# Patient Record
Sex: Female | Born: 1985
Health system: Southern US, Community
[De-identification: ages and names within clinical notes are randomized; demographics above are authoritative.]

## PROBLEM LIST (undated history)

## (undated) DIAGNOSIS — M1990 Unspecified osteoarthritis, unspecified site: Secondary | ICD-10-CM

## (undated) DIAGNOSIS — E039 Hypothyroidism, unspecified: Secondary | ICD-10-CM

## (undated) DIAGNOSIS — Z923 Personal history of irradiation: Secondary | ICD-10-CM

## (undated) DIAGNOSIS — J324 Chronic pansinusitis: Secondary | ICD-10-CM

## (undated) HISTORY — DX: Hypothyroidism, unspecified: E03.9

## (undated) HISTORY — PX: KNEE ARTHROSCOPY: SUR90

## (undated) HISTORY — DX: Personal history of irradiation: Z92.3

---
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
## 2006-09-25 ENCOUNTER — Ambulatory Visit: Payer: Self-pay | Admitting: Family Medicine

## 2007-04-29 ENCOUNTER — Ambulatory Visit: Payer: Self-pay | Admitting: Family Medicine

## 2007-04-29 DIAGNOSIS — E039 Hypothyroidism, unspecified: Secondary | ICD-10-CM | POA: Insufficient documentation

## 2007-05-03 LAB — CONVERTED CEMR LAB
ALT: 18 units/L (ref 0–35)
AST: 24 units/L (ref 0–37)
Albumin: 4 g/dL (ref 3.5–5.2)
Alkaline Phosphatase: 58 units/L (ref 39–117)
BUN: 12 mg/dL (ref 6–23)
Basophils Absolute: 0 10*3/uL (ref 0.0–0.1)
Basophils Relative: 0.4 % (ref 0.0–1.0)
Bilirubin, Direct: 0.1 mg/dL (ref 0.0–0.3)
CO2: 26 meq/L (ref 19–32)
Calcium: 9.4 mg/dL (ref 8.4–10.5)
Chloride: 102 meq/L (ref 96–112)
Cholesterol: 164 mg/dL (ref 0–200)
Creatinine, Ser: 0.7 mg/dL (ref 0.4–1.2)
Eosinophils Absolute: 0.4 10*3/uL (ref 0.0–0.6)
Eosinophils Relative: 8.3 % — ABNORMAL HIGH (ref 0.0–5.0)
GFR calc Af Amer: 133 mL/min
GFR calc non Af Amer: 110 mL/min
Glucose, Bld: 82 mg/dL (ref 70–99)
HCT: 38.4 % (ref 36.0–46.0)
HDL: 48.5 mg/dL (ref >39.0)
Hemoglobin: 12.9 g/dL (ref 12.0–15.0)
LDL Cholesterol: 100 mg/dL — ABNORMAL HIGH (ref 0–99)
Lymphocytes Relative: 29.4 % (ref 12.0–46.0)
MCHC: 33.6 g/dL (ref 30.0–36.0)
MCV: 83 fL (ref 78.0–100.0)
Monocytes Absolute: 0.4 10*3/uL (ref 0.2–0.7)
Monocytes Relative: 9.4 % (ref 3.0–11.0)
Neutro Abs: 2.2 10*3/uL (ref 1.4–7.7)
Neutrophils Relative %: 52.5 % (ref 43.0–77.0)
Platelets: 270 10*3/uL (ref 150–400)
Potassium: 4.3 meq/L (ref 3.5–5.1)
RBC: 4.63 M/uL (ref 3.87–5.11)
RDW: 11.8 % (ref 11.5–14.6)
Sodium: 136 meq/L (ref 135–145)
TSH: 7.43 microintl units/mL — ABNORMAL HIGH (ref 0.35–5.50)
Total Bilirubin: 0.7 mg/dL (ref 0.3–1.2)
Total CHOL/HDL Ratio: 3.4
Total Protein: 7.1 g/dL (ref 6.0–8.3)
Triglycerides: 78 mg/dL (ref 0–149)
VLDL: 16 mg/dL (ref 0–40)
WBC: 4.3 10*3/uL — ABNORMAL LOW (ref 4.5–10.5)

## 2007-06-08 ENCOUNTER — Ambulatory Visit: Payer: Self-pay | Admitting: Family Medicine

## 2007-06-09 LAB — CONVERTED CEMR LAB: TSH: 5.45 microintl units/mL (ref 0.35–5.50)

## 2007-08-19 ENCOUNTER — Ambulatory Visit: Payer: Self-pay | Admitting: Internal Medicine

## 2007-09-30 ENCOUNTER — Ambulatory Visit: Payer: Self-pay | Admitting: Family Medicine

## 2007-10-29 ENCOUNTER — Ambulatory Visit: Payer: Self-pay | Admitting: Family Medicine

## 2007-10-29 ENCOUNTER — Encounter (INDEPENDENT_AMBULATORY_CARE_PROVIDER_SITE_OTHER): Payer: Self-pay | Admitting: Internal Medicine

## 2007-11-01 LAB — CONVERTED CEMR LAB: TSH: 2.965 microintl units/mL (ref 0.350–5.50)

## 2007-11-25 ENCOUNTER — Ambulatory Visit: Payer: Self-pay | Admitting: Family Medicine

## 2008-01-24 ENCOUNTER — Ambulatory Visit: Payer: Self-pay | Admitting: Family Medicine

## 2008-01-31 LAB — CONVERTED CEMR LAB
BUN: 11 mg/dL (ref 6–23)
Basophils Relative: 0 % (ref 0.0–1.0)
CO2: 26 meq/L (ref 19–32)
Chloride: 105 meq/L (ref 96–112)
Creatinine, Ser: 0.9 mg/dL (ref 0.4–1.2)
Eosinophils Relative: 3.4 % (ref 0.0–5.0)
FSH: 0.7 milliintl units/mL
Lymphocytes Relative: 23.7 % (ref 12.0–46.0)
Neutrophils Relative %: 67.3 % (ref 43.0–77.0)
RBC: 4.65 M/uL (ref 3.87–5.11)
WBC: 6.4 10*3/uL (ref 4.5–10.5)

## 2008-02-04 ENCOUNTER — Ambulatory Visit: Payer: Self-pay | Admitting: Family Medicine

## 2008-02-04 ENCOUNTER — Encounter (INDEPENDENT_AMBULATORY_CARE_PROVIDER_SITE_OTHER): Payer: Self-pay | Admitting: Internal Medicine

## 2008-03-17 ENCOUNTER — Encounter: Payer: Self-pay | Admitting: Family Medicine

## 2008-03-17 ENCOUNTER — Ambulatory Visit: Payer: Self-pay | Admitting: Family Medicine

## 2008-03-21 LAB — CONVERTED CEMR LAB: TSH: 1.607 microintl units/mL (ref 0.350–5.50)

## 2008-11-21 ENCOUNTER — Telehealth (INDEPENDENT_AMBULATORY_CARE_PROVIDER_SITE_OTHER): Payer: Self-pay | Admitting: Internal Medicine

## 2008-11-22 ENCOUNTER — Ambulatory Visit: Payer: Self-pay | Admitting: Family Medicine

## 2008-11-24 LAB — CONVERTED CEMR LAB: TSH: 0.4 microintl units/mL (ref 0.35–5.50)

## 2008-11-25 ENCOUNTER — Ambulatory Visit: Payer: Self-pay | Admitting: Family Medicine

## 2009-02-17 LAB — CONVERTED CEMR LAB
Pap Smear: NORMAL
Pap Smear: NORMAL

## 2009-06-21 ENCOUNTER — Ambulatory Visit: Payer: Self-pay | Admitting: Family Medicine

## 2009-11-27 ENCOUNTER — Telehealth (INDEPENDENT_AMBULATORY_CARE_PROVIDER_SITE_OTHER): Payer: Self-pay | Admitting: *Deleted

## 2009-11-30 ENCOUNTER — Ambulatory Visit: Payer: Self-pay | Admitting: Family Medicine

## 2009-11-30 DIAGNOSIS — R0789 Other chest pain: Secondary | ICD-10-CM | POA: Insufficient documentation

## 2009-12-03 LAB — CONVERTED CEMR LAB: TSH: 0.925 microintl units/mL (ref 0.350–4.500)

## 2010-01-04 ENCOUNTER — Ambulatory Visit: Payer: Self-pay | Admitting: Family Medicine

## 2010-01-04 DIAGNOSIS — J029 Acute pharyngitis, unspecified: Secondary | ICD-10-CM

## 2010-01-09 ENCOUNTER — Telehealth: Payer: Self-pay | Admitting: Family Medicine

## 2010-10-30 ENCOUNTER — Telehealth: Payer: Self-pay | Admitting: Family Medicine

## 2010-11-19 NOTE — Assessment & Plan Note (Signed)
Summary: MED REFILL/RBH   Vital Signs:  Patient profile:   24 year old female Height:      64 inches Weight:      129.2 pounds BMI:     22.26 Temp:     98.3 degrees F oral Pulse rate:   80 / minute Pulse rhythm:   regular BP sitting:   108 / 62  (left arm) Cuff size:   regular  Vitals Entered By: Benny Lennert CMA [facility]) (November 30, 2009 4:39 PM)  History of Present Illness: Chief complaint med refill   Needs med refill of thyroid medicaiton. Due for yearly TSH.  Was sick with cold few weeks ago.. One episode of sharp pain  left chest for few days. Some heaviness in following days, no chest pain now.  Occ feels heart beat skips...  No decongestant, but was on other medicaiton.  Has history of asthma.   Also dry flacky skin on hands.   Problems Prior to Update: 1)  Skin Rash  (ICD-782.1) 2)  Eustachian Tube Dysfunction  (ICD-381.81) 3)  Viral Exanthem  (ICD-057.9) 4)  Uri  (ICD-465.9) 5)  Weight Loss  (ICD-783.21) 6)  Fatigue  (ICD-780.79) 7)  Hypothyroidism  (ICD-244.9)  Current Medications (verified): 1)  Ortho Tri-Cyclen Lo 0.025 Mg Tabs (Norgestimate-Ethinyl Estradiol) .... Take 1 Tablet By Mouth Once A Day 2)  Levothroid 150 Mcg  Tabs (Levothyroxine Sodium) .Marland Kitchen.. 1 Daily By Mouth 3)  Multivitamins   Tabs (Multiple Vitamin) .... One Daily 4)  Nasonex 50 Mcg/act Susp (Mometasone Furoate) .... 2 Sprays Each Nostril Once Daily 5)  Triamcinolone Acetonide 0.5 % Crea (Triamcinolone Acetonide) .... Apply To Two Times A Day X 2 Weeks.  Allergies: 1)  ! * Codiene  Past History:  Past medical, surgical, family and social histories (including risk factors) reviewed, and no changes noted (except as noted below).  Past Medical History: Reviewed history from 08/19/2007 and no changes required. Hypothyroidism  Past Surgical History: Reviewed history from 08/19/2007 and no changes required. RAI for hyperthyroidism  Family History: Reviewed history and no  changes required.  Social History: Reviewed history from 11/25/2007 and no changes required. Occupation: PE Runner, broadcasting/film/video, works at gym Single Never Smoked Alcohol use-occ  moved t [place]--bought a house.  Review of Systems General:  Denies fatigue and fever. ENT:  Denies earache, postnasal drainage, sinus pressure, and sore throat. Resp:  Denies shortness of breath. GI:  Denies abdominal pain. GU:  Denies dysuria. Derm:  very dry flacky skin on fingers, fingertips..  Physical Exam  General:  Well-developed,well-nourished,in no acute distress; alert,appropriate and cooperative throughout examination Eyes:  No corneal or conjunctival inflammation noted. EOMI. Perrla. Funduscopic exam benign, without hemorrhages, exudates or papilledema. Vision grossly normal. Ears:  External ear exam shows no significant lesions or deformities.  Otoscopic examination reveals clear canals, tympanic membranes are intact bilaterally without bulging, retraction, inflammation or discharge. Hearing is grossly normal bilaterally. Nose:  External nasal examination shows no deformity or inflammation. Nasal mucosa are pink and moist without lesions or exudates. Mouth:  Oral mucosa and oropharynx without lesions or exudates.  Teeth in good repair. Neck:  no carotid bruit or thyromegaly no cervical or supraclavicular lymphadenopathy  Lungs:  Normal respiratory effort, chest expands symmetrically. Lungs are clear to auscultation, no crackles or wheezes. Heart:  Normal rate and regular rhythm. S1 and S2 normal without gallop, murmur, click, rub or other extra sounds. Pulses:  R and L posterior tibial pulses are full and equal bilaterally  Extremities:  no edema  Skin:  dry flacky skin on B hands   Impression & Recommendations:  Problem # 1:  HYPOTHYROIDISM (ICD-244.9) Chest symptoms may be due to poorly control thyroid. Due for eval. medicaiton was already refilled for a year priopr to OV.  Her updated medication  list for this problem includes:    Levothroid 150 Mcg Tabs (Levothyroxine sodium) .Marland Kitchen... 1 daily by mouth  Orders: Specimen Handling (13086) T-TSH ([address])  Problem # 2:  SKIN RASH (ICD-782.1) Hand dermatitis. Discussed hand care, moisturizing cream. USe topical steroid for flares x 2 weeks.  Her updated medication list for this problem includes:    Triamcinolone Acetonide 0.5 % Crea (Triamcinolone acetonide) .Marland Kitchen... Apply to two times a day x 2 weeks.  Problem # 3:  CHEST DISCOMFORT, ATYPICAL (ICD-786.59) Likely due to recent URI illness. Low risk for cardiac issues.  If recurs, make follow up for eval.   Complete Medication List: 1)  Ortho Tri-cyclen Lo 0.025 Mg Tabs (Norgestimate-ethinyl estradiol) .... Take 1 tablet by mouth once a day 2)  Levothroid 150 Mcg Tabs (Levothyroxine sodium) .Marland Kitchen.. 1 daily by mouth 3)  Multivitamins Tabs (Multiple vitamin) .... One daily 4)  Nasonex 50 Mcg/act Susp (Mometasone furoate) .... 2 sprays each nostril once daily 5)  Triamcinolone Acetonide 0.5 % Crea (Triamcinolone acetonide) .... Apply to two times a day x 2 weeks.  Patient Instructions: 1)  Steroid cream on hands. 2)  Change to cream ..like eucerin, cetaphil cream. 3)  Hypoallergenic soap.Marland Kitchen 4)  Follpow up if heart racing not improving.  Prescriptions: TRIAMCINOLONE ACETONIDE 0.5 % CREA (TRIAMCINOLONE ACETONIDE) Apply to two times a day x 2 weeks.  #30 gm x 0   Entered and Authorized by:   Kerby Nora MD   Signed by:   Kerby Nora MD on 11/30/2009   Method used:   Electronically to        [facility] (retail)       [address]       [place], [place]  [address]       Ph: [phone]       Fax: [phone]   RxID:   [id number]     Current Allergies (reviewed today): ! * CODIENE  PAP Result Date:  02/17/2009 PAP Result:  normal PAP Next Due:  1 yr

## 2010-11-19 NOTE — Assessment & Plan Note (Signed)
Summary: SORE THROAT/DLO   Vital Signs:  Patient profile:   25 year old female Height:      64 inches Weight:      126.13 pounds BMI:     21.73 Temp:     98.5 degrees F oral Pulse rate:   80 / minute Pulse rhythm:   regular BP sitting:   104 / 72  (left arm) Cuff size:   regular  Vitals Entered By: Delilah Shan CMA [facility]) (January 04, 2010 8:13 AM) CC: Congestion, ST   History of Present Illness: 25 yo healthy female here with sore throat. Was congested on and off for past month but a few days ago, throat became very sore. Hurts to swallow. Felt feverish. No rashes or abdominal pain. School teacher, strep throat going around school.  Felt a bump in back of her tongue yesterday, not painful.  Current Medications (verified): 1)  Ortho Tri-Cyclen Lo 0.025 Mg Tabs (Norgestimate-Ethinyl Estradiol) .... Take 1 Tablet By Mouth Once A Day 2)  Levothroid 150 Mcg  Tabs (Levothyroxine Sodium) .Marland Kitchen.. 1 Daily By Mouth 3)  Multivitamins   Tabs (Multiple Vitamin) .... One Daily  Allergies: 1)  ! * Codiene  Review of Systems      See HPI General:  Complains of fever; denies chills. Resp:  Denies cough, shortness of breath, sputum productive, and wheezing. GI:  Denies abdominal pain, nausea, and vomiting.  Physical Exam  General:  Well-developed,well-nourished,in no acute distress; alert,appropriate and cooperative throughout examination Ears:  External ear exam shows no significant lesions or deformities.  Otoscopic examination reveals clear canals, tympanic membranes are intact bilaterally without bulging, retraction, inflammation or discharge. Hearing is grossly normal bilaterally. Mouth:  pharyngeal erythema.   no exudates. small, firm, pink nodule on back of left tongue Lungs:  Normal respiratory effort, chest expands symmetrically. Lungs are clear to auscultation, no crackles or wheezes. Heart:  Normal rate and regular rhythm. S1 and S2 normal without gallop, murmur, click, rub  or other extra sounds. Psych:  Cognition and judgment appear intact. Alert and cooperative with normal attention span and concentration. No apparent delusions, illusions, hallucinations   Impression & Recommendations:  Problem # 1:  ACUTE PHARYNGITIS (ICD-462) Assessment New Rapid strep neg, no other cardinal symptoms. Likely viral.  Continue supportive care with Tylenol. Area on tongue likely reactive or fibroma, advised if no improvement in 1-2 weeks, needs follow up and referral to ENT.  Orders: Rapid Strep (13086)  Complete Medication List: 1)  Ortho Tri-cyclen Lo 0.025 Mg Tabs (Norgestimate-ethinyl estradiol) .... Take 1 tablet by mouth once a day 2)  Levothroid 150 Mcg Tabs (Levothyroxine sodium) .Marland Kitchen.. 1 daily by mouth 3)  Multivitamins Tabs (Multiple vitamin) .... One daily 4)  Proair Hfa 108 (90 Base) Mcg/act Aers (Albuterol sulfate) .... 2 inh q4h as needed shortness of breath Prescriptions: PROAIR HFA 108 (90 BASE) MCG/ACT  AERS (ALBUTEROL SULFATE) 2 inh q4h as needed shortness of breath  #1 x 0   Entered and Authorized by:   Ruthe Mannan MD   Signed by:   Ruthe Mannan MD on 01/04/2010   Method used:   Electronically to        [facility] (retail)       [address]       [place], [place]  [address]       Ph: [phone]       Fax: [phone]   RxID:  [id number]   Current Allergies (reviewed today): ! * CODIENE Laboratory Results   Date/Time Reported: January 04, 2010 8:26 AM   Other Tests  Rapid Strep: negative

## 2010-11-19 NOTE — Progress Notes (Signed)
Summary: Not feeling any better  Phone Note Call from Patient Call back at Home Phone [phone]   Caller: Patient Call For: Dr. Dayton Martes Summary of Call: Patient was seen on 03/18 for a sore throat and was told if she was no better to call back and Dr. Dayton Martes would give her an antibiotic.  She is feeling no better.  Uses [facility].  Please advise. Initial call taken by: Linde Gillis CMA [facility]),  January 09, 2010 4:05 PM    New/Updated Medications: AZITHROMYCIN 250 MG  TABS (AZITHROMYCIN) 2 by  mouth today and then 1 daily for 4 days Prescriptions: AZITHROMYCIN 250 MG  TABS (AZITHROMYCIN) 2 by  mouth today and then 1 daily for 4 days  #6 x 0   Entered and Authorized by:   Ruthe Mannan MD   Signed by:   Ruthe Mannan MD on 01/09/2010   Method used:   Electronically to        [facility] (retail)       [address]       [place], [place]  [address]       Ph: [phone]       Fax: [phone]   RxID:   [id number]

## 2010-11-19 NOTE — Progress Notes (Signed)
Summary: refill request for levothroid  Phone Note Refill Request Message from:  Fax from Pharmacy  Refills Requested: Medication #1:  LEVOTHROID 150 MCG  TABS 1 DAILY BY MOUTH   Last Refilled: 09/02/2009 [facility]axed [facility] from [facility] [place].  I know that I am supposed to refil this for one year but her last tsh was a year ago and she has since only been seen for ear pain.  Please advise.  Initial call taken by: Lowella Petties CMA,  November 27, 2009 11:01 AM  Follow-up for Phone Call        Needs to make appt for CPX with labs prior TSH Dx 244.9, CMET, lipids. Dx v77.91 Unless having CPX at GYN.  Refill until appt.  Follow-up by: Kerby Nora MD,  November 27, 2009 2:15 PM  Additional Follow-up for Phone Call Additional follow up Details #1::        Left message on voicemail for patient to return call.  Linde Gillis CMA [facility])  November 27, 2009 2:50 PM   Spoke with patient, she has an appt with Dr. Ermalene Searing on 11/30/2009.  She sees a GYN for her yearly pap smears.  Will go ahead and [facility] [place] for thyroid medication.   Additional Follow-up by: Linde Gillis CMA [facility]),  November 28, 2009 12:21 PM

## 2010-11-21 NOTE — Progress Notes (Signed)
Summary: regarding office visit  Phone Note Call from Patient Call back at [facility] Phone [phone]   Caller: Patient Summary of Call: This is a former pt of Billie's that saw you last february to renew her meds.  She is again due for appt.  She is asking if she can just come in for labwork only.  Do you want her to schedule a get established visit with you, since she has not had that yet, or a regular visit or just labs? Initial call taken by: Lowella Petties CMA, AAMA,  October 30, 2010 9:37 AM  Follow-up for Phone Call        Needs appt for labs and CPX unless CPX done at GYN. If done at GYN have her schedule an 15 min follow up. Follow-up by: Kerby Nora MD,  October 30, 2010 10:13 PM  Additional Follow-up for Phone Call Additional follow up Details #1::        Patient advised via personal voice mail.Consuello Masse CMA   Additional Follow-up by: Benny Lennert CMA [facility]),  November 01, 2010 1:18 PM

## 2010-11-28 ENCOUNTER — Encounter: Payer: Self-pay | Admitting: Family Medicine

## 2011-01-17 ENCOUNTER — Telehealth: Payer: Self-pay | Admitting: Family Medicine

## 2011-01-17 DIAGNOSIS — E039 Hypothyroidism, unspecified: Secondary | ICD-10-CM

## 2011-01-17 DIAGNOSIS — Z13220 Encounter for screening for lipoid disorders: Secondary | ICD-10-CM

## 2011-01-17 NOTE — Telephone Encounter (Signed)
Message copied by Kerby Nora on Fri Jan 17, 2011 11:00 AM ------      Message from: Margarite Gouge, NATASHA      Created: Thu Jan 16, 2011 11:20 AM      Regarding: Cpx labs mon       Please order  future cpx labs for pt's upcomming lab appt.      Thanks      Rodney Booze

## 2011-01-20 ENCOUNTER — Other Ambulatory Visit (INDEPENDENT_AMBULATORY_CARE_PROVIDER_SITE_OTHER): Payer: BC Managed Care – PPO | Admitting: Family Medicine

## 2011-01-20 DIAGNOSIS — E039 Hypothyroidism, unspecified: Secondary | ICD-10-CM

## 2011-01-20 DIAGNOSIS — Z13220 Encounter for screening for lipoid disorders: Secondary | ICD-10-CM

## 2011-01-20 LAB — COMPREHENSIVE METABOLIC PANEL
ALT: 22 U/L (ref 0–35)
AST: 24 U/L (ref 0–37)
Albumin: 3.7 g/dL (ref 3.5–5.2)
Alkaline Phosphatase: 71 U/L (ref 39–117)
BUN: 9 mg/dL (ref 6–23)
Potassium: 4.6 mEq/L (ref 3.5–5.1)
Sodium: 135 mEq/L (ref 135–145)

## 2011-01-20 LAB — LIPID PANEL
HDL: 48.1 mg/dL (ref >39.00)
LDL Cholesterol: 106 mg/dL — ABNORMAL HIGH (ref 0–99)
Total CHOL/HDL Ratio: 3
Triglycerides: 43 mg/dL (ref 0.0–149.0)

## 2011-01-21 ENCOUNTER — Encounter: Payer: Self-pay | Admitting: Family Medicine

## 2011-01-21 ENCOUNTER — Ambulatory Visit (INDEPENDENT_AMBULATORY_CARE_PROVIDER_SITE_OTHER): Payer: BC Managed Care – PPO | Admitting: Family Medicine

## 2011-01-21 VITALS — BP 92/70 | HR 82 | Temp 98.3°F | Ht 65.0 in | Wt 125.8 lb

## 2011-01-21 DIAGNOSIS — E039 Hypothyroidism, unspecified: Secondary | ICD-10-CM

## 2011-01-21 DIAGNOSIS — R5381 Other malaise: Secondary | ICD-10-CM

## 2011-01-21 DIAGNOSIS — R5383 Other fatigue: Secondary | ICD-10-CM | POA: Insufficient documentation

## 2011-01-21 MED ORDER — LEVOTHYROXINE SODIUM 150 MCG PO TABS: 150.0000 ug | ORAL_TABLET | Freq: Every day | ORAL | Status: DC

## 2011-01-21 NOTE — Assessment & Plan Note (Signed)
Lab Results  Component Value Date   TSH 0.90 01/20/2011   Well controlled. Continue current medication.

## 2011-01-21 NOTE — Assessment & Plan Note (Addendum)
Shaking spells prior to lunch.. Feels better when eats... Likely hypoglycemia given low fat mass and very active individual.  Increase protein and snacks between meals.  Thyroid well controlled and no suggestion of other cause of fatigue (labs reviewed).. Not occuring on a daily basis.

## 2011-01-21 NOTE — Patient Instructions (Addendum)
Look into tetanus .Marland Kitchen TDAP ot TD... As to when last received. If not in the last 10 years call for nurse visit to receive TDap. Increase protein in diet, eat healthy snacks in between meals.  Avoid high carb foods.

## 2011-01-21 NOTE — Progress Notes (Signed)
Subjective:    Patient ID: [facility], female    DOB: [birth year], 25 y.o.   MRN: [id number]  HPI Here for yearly follow up.  Last saw GYN almost 1 year ago.  On OCP.Marland Kitchen Doing well...some SE now on higher dose ortho tricyclen. Did better on low but insurance does not cover.   Whooping cough found at school... Not mandated to take medication. No current cough, cold symptoms. No fever.  Occasionally feels like blood sugar is low... Feels shaky weak, lightheaded. Lst time was 1 week ago.. Had eated egg sandwich...4 hours earlier. Did have some increase in frequency of urination. Did not check blood sugar. Checked blood sugar 2 hours after lunch was 109.  Hypothyroid.. Well controlled.     Review of Systems  Constitutional: Negative for fever, fatigue and unexpected weight change.  HENT: Negative for ear pain, congestion, sore throat, sneezing, trouble swallowing and sinus pressure.   Eyes: Negative for pain and itching.  Respiratory: Negative for cough, shortness of breath and wheezing.   Cardiovascular: Negative for chest pain, palpitations and leg swelling.  Gastrointestinal: Negative for nausea, abdominal pain, diarrhea, constipation and blood in stool.  Genitourinary: Negative for dysuria, hematuria, vaginal discharge, difficulty urinating and menstrual problem.  Skin: Negative for rash.  Neurological: Negative for syncope, weakness, light-headedness, numbness and headaches.  Psychiatric/Behavioral: Negative for confusion and dysphoric mood. The patient is not nervous/anxious.        Objective:   Physical Exam  Constitutional: Vital signs are normal. She appears well-developed and well-nourished. She is cooperative.  Non-toxic appearance. She does not appear ill. No distress.  HENT:  Head: Normocephalic.  Right Ear: Hearing, tympanic membrane, external ear and ear canal normal. Tympanic membrane is not erythematous, not retracted and not bulging.  Left Ear: Hearing,  tympanic membrane, external ear and ear canal normal. Tympanic membrane is not erythematous, not retracted and not bulging.  Nose: Mucosal edema and rhinorrhea present. Right sinus exhibits no maxillary sinus tenderness and no frontal sinus tenderness. Left sinus exhibits no maxillary sinus tenderness and no frontal sinus tenderness.  Mouth/Throat: Uvula is midline, oropharynx is clear and moist and mucous membranes are normal.  Eyes: Conjunctivae, EOM and lids are normal. Pupils are equal, round, and reactive to light. No foreign bodies found.  Neck: Trachea normal and normal range of motion. Neck supple. Carotid bruit is not present. No mass and no thyromegaly present.  Cardiovascular: Normal rate, regular rhythm, S1 normal, S2 normal, normal heart sounds, intact distal pulses and normal pulses.  Exam reveals no gallop and no friction rub.   No murmur heard. Pulmonary/Chest: Effort normal and breath sounds normal. Not tachypneic. No respiratory distress. She has no decreased breath sounds. She has no wheezes. She has no rhonchi. She has no rales.  Abdominal: Soft. Normal appearance and bowel sounds are normal. She exhibits no fluid wave and no abdominal bruit. There is no hepatosplenomegaly. There is no tenderness. There is no rebound and no CVA tenderness. No hernia.  Genitourinary: Vagina normal and uterus normal.  Neurological: She is alert. She has normal strength. No cranial nerve deficit or sensory deficit.  Skin: Skin is warm, dry and intact. No rash noted.  Psychiatric: Her speech is normal and behavior is normal. Judgment normal. Her mood appears not anxious. Cognition and memory are normal. She does not exhibit a depressed mood.          Assessment & Plan:  No physical performed today given Pt sees GYN. Updated prevention.Marland KitchenMarland Kitchen  May be due for Tdap. Labs reviewed in detail with pt.

## 2011-02-12 ENCOUNTER — Encounter: Payer: Self-pay | Admitting: Family Medicine

## 2011-02-12 ENCOUNTER — Ambulatory Visit (INDEPENDENT_AMBULATORY_CARE_PROVIDER_SITE_OTHER): Payer: BC Managed Care – PPO | Admitting: Family Medicine

## 2011-02-12 DIAGNOSIS — R197 Diarrhea, unspecified: Secondary | ICD-10-CM | POA: Insufficient documentation

## 2011-02-12 DIAGNOSIS — H6980 Other specified disorders of Eustachian tube, unspecified ear: Secondary | ICD-10-CM

## 2011-02-12 DIAGNOSIS — H6990 Unspecified Eustachian tube disorder, unspecified ear: Secondary | ICD-10-CM | POA: Insufficient documentation

## 2011-02-12 NOTE — Patient Instructions (Signed)
I would get an over the counter decongestant and take this before the plane trip.  You can use nasal saline in the meantime, this may also help.  Let us know if the abdominal pain is progressive, if you have fever, blood in your stool, or other concerns.  Take care.

## 2011-02-12 NOTE — Progress Notes (Signed)
L ear pain yesterday.  Felt clogged yesterday.  Used OTC drops with some relief.  Some pain on R ear but less than L.  No rhinorrhea, no ST.  No fevers, no vomiting.  Occ abdominal pains intermittently (and in multiple quadrants) over last few days, diarrhea yesterday.  No other complaints.   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    Meds, vitals, and allergies reviewed.   ROS: See HPI.  Otherwise, noncontributory.  nad ncat Tm wnl, no erythema but tm sluggish on valsalva Nasal exam with mild erythema, op with mild erythema but no exudates Neck supple rrr ctab abd soft, RLQ mildly ttp but without rebound, not ttp o/w.

## 2011-02-12 NOTE — Assessment & Plan Note (Signed)
Likely from a viral process.  Benign exam.  I would treat supportively.  She is getting ready to fly and a decongestant may help some.  Fu prn.

## 2011-02-12 NOTE — Assessment & Plan Note (Signed)
Not dehydrated and benign exam.  The RLQ pain is mild. I don't suspect sig pathology (ie appendicitis) since she has been having sx for the last 5 days and the sx are intermittent.  I would have her follow this clinically and report back as needed.  She agrees.  She is nontoxic, well appearing.  D/w pt and she understood.

## 2011-02-24 ENCOUNTER — Other Ambulatory Visit: Payer: Self-pay | Admitting: *Deleted

## 2011-02-24 MED ORDER — LEVOTHYROXINE SODIUM 150 MCG PO TABS: 150.0000 ug | ORAL_TABLET | Freq: Every day | ORAL | Status: DC

## 2011-02-24 NOTE — Telephone Encounter (Signed)
Patient was traveling this weekend and lost her prescription for her thyroid medication. Patient needs a new script sent to the pharmacy for her thyroid medication. Pharmacy- [facility] [place]

## 2011-03-07 NOTE — Assessment & Plan Note (Signed)
[facility]                           [facility] NOTE   Sheena, Harris                         MRN:          [id number]  DATE:09/25/2006                            DOB:          [birth year]    CHIEF COMPLAINT:  A 25 year old female here to establish new doctor.   HISTORY OF PRESENT ILLNESS:  Sheena Harris moved to the area from [place]  following going to school at [employer].  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  She has the following concerns.  1. Hypothyroidism:  She initially had hyperthyroidism.  Was treated      with PTU but had side effects.  She then received radioactive      iodine which finally resulted in hypothyroidism.  She now gets      levothyroxine 88 mcg daily.  She states her TSH was checked last in      August 2007.  She states at that time it was high normal and so she      was told to have it checked several months later.  She also needs      refills of this medication.  2. Several moles that she would like to have evaluated:  She states      that there is a new, slightly itchy mole on her right buttock that      is erythematous, not bleeding.  There are also several moles on her      back that have been there forever but she just likes to keep an eye      on it.   REVIEW OF SYSTEMS:  No headache, no syncope, no dizziness, no hearing  problems, no vision problems.  No dyspnea, no chest pain, no  palpitations.  No nausea or vomiting, diarrhea, constipation, bleeding.   PAST MEDICAL HISTORY:  Hypothyroidism, status post radioactive iodine,  now hypothyroid.   HOSPITALIZATIONS/SURGERY/PROCEDURES:  1. In 2002 and 2003, right knee surgery x2 for torn medial meniscus.  2. In 2002, deviated septum repair.  3. Pap smear negative in January 2006.   ALLERGIES:  CODEINE causing nausea and vomiting.   MEDICATIONS:  1. Levothyroxine 88 mcg daily.  2. Ortho  Tri-Cyclen Lo daily.   FAMILY HISTORY:  Father alive at age 65, healthy.  Mother alive at age  64 with hypertension and breast cancer diagnosed at age 29.  BRCA  negative.  No MI before 65 in the family.  She has two sisters who are  healthy.  There is no family history of coronary artery disease or  diabetes.  She has an uncle with GI cancer of unknown type.   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  She  is single but is currently in a relationship with a female and they use  condoms.  She gets very regular exercise and actually in addition to her  job, goes to the gym three to five times per week.  She eats fruits and  vegetables.  No  fast food.  Lots of water.   PHYSICAL EXAMINATION:  VITAL SIGNS:  Height 65 inches.  Weight 130.  Blood pressure 108/80, pulse 80, temperature 98.2.  GENERAL:  Healthy-appearing female in no apparent distress.  HEENT:  PERRLA.  Extraocular muscles intact.  Oropharynx clear.  Tympanic membranes clear.  Nares clear.  NECK:  No thyromegaly.  No lymphadenopathy, supraclavicular or cervical.  CARDIOVASCULAR:  Regular rate and rhythm.  No murmurs, rubs or gallops.  Normal PMI.  2+ peripheral pulses.  No peripheral edema.  LUNGS:  Clear to auscultation bilaterally.  No wheezes, rales or  rhonchi.  ABDOMEN:  Soft, nontender.  Normoactive bowel sounds.  No  hepatosplenomegaly.  MUSCULOSKELETAL: Strength 5/5 in upper and lower extremities.  SKIN:  She has three lesions on her posterior neck.  Two that are  elevated approximately 0.5 cm, even, light-colored nevi and one that is  inferior.  It makes the inferior part of the isosceles triangle that  these three moles form.  The lower one is 3 mm in diameter. It is fairly  dark in color but is also fairly regular.  She states that as far as she  knows, these have not changed recently.  She also has several freckles  throughout her body that are not worrisome.  She has an area, a 0.5 cm   raised lesion on her right buttock that does not have worrisome  characteristics.   ASSESSMENT/PLAN:  1. Hypothyroidism.  We will check a TSH today.  Depending on the      results, we will refill her levothyroxine appropriately.  2. Skin lesions.  The majority of her lesions are normal nevi as well      as freckles.  The most concerning lesion is one on her neck that is      3 mm and dark in color.  We will watch this and will consider      removing it at her next appointment.  She also has an nonworrisome      area on her right posterior buttock which we will follow.  That      appears to be a nevi.  3. Prevention.  She is unsure of her last tetanus and I will obtain      her old records to determine this.  She will be due for her Pap      smear in January and will return for this as well as GC and      Chlamydia testing.  She refused flu vaccine today.     Sheena Nora, MD  Electronically Signed    AB/MedQ  DD: 09/25/2006  DT: 09/26/2006  Job #: [id number]

## 2012-02-18 ENCOUNTER — Other Ambulatory Visit: Payer: Self-pay | Admitting: Family Medicine

## 2012-02-20 ENCOUNTER — Other Ambulatory Visit: Payer: Self-pay | Admitting: *Deleted

## 2012-02-20 MED ORDER — LEVOTHYROXINE SODIUM 150 MCG PO TABS: 150.0000 ug | ORAL_TABLET | Freq: Every day | ORAL | Status: DC

## 2012-04-21 ENCOUNTER — Telehealth: Payer: Self-pay | Admitting: Family Medicine

## 2012-04-21 DIAGNOSIS — E039 Hypothyroidism, unspecified: Secondary | ICD-10-CM

## 2012-04-21 NOTE — Telephone Encounter (Signed)
Message copied by Excell Seltzer on Wed Apr 21, 2012  8:44 AM ------      Message from: Baldomero Lamy      Created: Wed Apr 21, 2012  7:38 AM      Regarding: cpx labs Fri 7/5       Please order  future cpx labs for pt's upcomming lab appt.      Thanks      Rodney Booze

## 2012-04-23 ENCOUNTER — Other Ambulatory Visit (INDEPENDENT_AMBULATORY_CARE_PROVIDER_SITE_OTHER): Payer: BC Managed Care – PPO

## 2012-04-23 DIAGNOSIS — E039 Hypothyroidism, unspecified: Secondary | ICD-10-CM

## 2012-04-23 LAB — TSH: TSH: 2.4 u[IU]/mL (ref 0.35–5.50)

## 2012-04-27 ENCOUNTER — Encounter: Payer: Self-pay | Admitting: Family Medicine

## 2012-04-27 ENCOUNTER — Ambulatory Visit (INDEPENDENT_AMBULATORY_CARE_PROVIDER_SITE_OTHER): Payer: BC Managed Care – PPO | Admitting: Family Medicine

## 2012-04-27 VITALS — BP 90/62 | HR 65 | Temp 98.1°F | Ht 65.0 in | Wt 122.8 lb

## 2012-04-27 DIAGNOSIS — E039 Hypothyroidism, unspecified: Secondary | ICD-10-CM

## 2012-04-27 MED ORDER — LEVOTHYROXINE SODIUM 150 MCG PO TABS: 150.0000 ug | ORAL_TABLET | Freq: Every day | ORAL | Status: DC

## 2012-04-27 NOTE — Patient Instructions (Signed)
Keep up good work!  

## 2012-04-27 NOTE — Assessment & Plan Note (Signed)
Well controlled on current dose of levothyroxine

## 2012-04-27 NOTE — Progress Notes (Signed)
  Subjective:    Patient ID: Sheena Harris, female    DOB: 08/14/1986, 26 y.o.   MRN: [id number]  HPI  26 year old female presents for annual follow up thyroid eval.  Hypothyroid: Well controlled.   Lab Results  Component Value Date   TSH 2.40 04/23/2012   Regular exercise, healthy eating.  Goes to [facility].. Dr. Dellis Anes. Last year pap nml.  Review of Systems  Constitutional: Negative for fever, fatigue and unexpected weight change.  HENT: Negative for ear pain, congestion, sore throat, sneezing, trouble swallowing and sinus pressure.   Eyes: Negative for pain and itching.  Respiratory: Negative for cough, shortness of breath and wheezing.   Cardiovascular: Negative for chest pain, palpitations and leg swelling.  Gastrointestinal: Negative for nausea, abdominal pain, diarrhea, constipation and blood in stool.  Genitourinary: Negative for dysuria, hematuria, vaginal discharge, difficulty urinating and menstrual problem.  Skin: Negative for rash.  Neurological: Negative for syncope, weakness, light-headedness, numbness and headaches.  Psychiatric/Behavioral: Negative for confusion and dysphoric mood. The patient is not nervous/anxious.        Objective:   Physical Exam  Constitutional: Vital signs are normal. She appears well-developed and well-nourished. She is cooperative.  Non-toxic appearance. She does not appear ill. No distress.  HENT:  Head: Normocephalic.  Right Ear: Hearing, tympanic membrane, external ear and ear canal normal. Tympanic membrane is not erythematous, not retracted and not bulging.  Left Ear: Hearing, tympanic membrane, external ear and ear canal normal. Tympanic membrane is not erythematous, not retracted and not bulging.  Nose: No mucosal edema or rhinorrhea. Right sinus exhibits no maxillary sinus tenderness and no frontal sinus tenderness. Left sinus exhibits no maxillary sinus tenderness and no frontal sinus tenderness.  Mouth/Throat: Uvula is  midline, oropharynx is clear and moist and mucous membranes are normal.  Eyes: Conjunctivae, EOM and lids are normal. Pupils are equal, round, and reactive to light. No foreign bodies found.  Neck: Trachea normal and normal range of motion. Neck supple. Carotid bruit is not present. No mass and no thyromegaly present.  Cardiovascular: Normal rate, regular rhythm, S1 normal, S2 normal, normal heart sounds, intact distal pulses and normal pulses.  Exam reveals no gallop and no friction rub.   No murmur heard. Pulmonary/Chest: Effort normal and breath sounds normal. Not tachypneic. No respiratory distress. She has no decreased breath sounds. She has no wheezes. She has no rhonchi. She has no rales.  Abdominal: Soft. Normal appearance and bowel sounds are normal. There is no tenderness.  Neurological: She is alert.  Skin: Skin is warm, dry and intact. No rash noted.  Psychiatric: Her speech is normal and behavior is normal. Judgment and thought content normal. Her mood appears not anxious. Cognition and memory are normal. She does not exhibit a depressed mood.          Assessment & Plan:  The patient's preventative maintenance and recommended screening tests for an annual wellness exam were reviewed in full today. Brought up to date unless services declined.  Counselled on the importance of diet, exercise, and its role in overall health and mortality. The patient's FH and SH was reviewed, including their home life, tobacco status, and drug and alcohol status.   [place] uptodate: HPV and Td last given 2004, due  2014. PAP at GYN Mother with breast cancer at age 78, recurrent age 52. Plan mammo at age 49. No family history  colon cancer  LAst year lipids and DMscreen negative.  Nonsmoker.

## 2012-05-28 ENCOUNTER — Other Ambulatory Visit: Payer: BC Managed Care – PPO

## 2012-06-04 ENCOUNTER — Encounter: Payer: BC Managed Care – PPO | Admitting: Family Medicine

## 2013-05-10 ENCOUNTER — Other Ambulatory Visit: Payer: Self-pay | Admitting: Family Medicine

## 2013-05-25 ENCOUNTER — Telehealth: Payer: Self-pay | Admitting: Family Medicine

## 2013-05-25 ENCOUNTER — Other Ambulatory Visit (INDEPENDENT_AMBULATORY_CARE_PROVIDER_SITE_OTHER): Payer: BC Managed Care – PPO

## 2013-05-25 DIAGNOSIS — Z13220 Encounter for screening for lipoid disorders: Secondary | ICD-10-CM

## 2013-05-25 DIAGNOSIS — E039 Hypothyroidism, unspecified: Secondary | ICD-10-CM

## 2013-05-25 LAB — TSH: TSH: 1.21 u[IU]/mL (ref 0.35–5.50)

## 2013-05-25 LAB — LIPID PANEL
HDL: 49.4 mg/dL (ref >39.00)
Total CHOL/HDL Ratio: 3
Triglycerides: 59 mg/dL (ref 0.0–149.0)
VLDL: 11.8 mg/dL (ref 0.0–40.0)

## 2013-05-25 LAB — COMPREHENSIVE METABOLIC PANEL
ALT: 18 U/L (ref 0–35)
AST: 22 U/L (ref 0–37)
BUN: 11 mg/dL (ref 6–23)
Creatinine, Ser: 0.7 mg/dL (ref 0.4–1.2)
Total Bilirubin: 0.4 mg/dL (ref 0.3–1.2)

## 2013-05-25 NOTE — Telephone Encounter (Signed)
Message copied by Excell Seltzer on Wed May 25, 2013  8:29 AM ------      Message from: Sheena Harris      Created: Tue May 10, 2013 11:58 AM      Regarding: f/u labs Wed 8/6       Please order  future cpx labs for pt's upcoming lab appt.      Thanks      Tasha       ------

## 2013-06-01 ENCOUNTER — Encounter: Payer: Self-pay | Admitting: Family Medicine

## 2013-06-01 ENCOUNTER — Ambulatory Visit (INDEPENDENT_AMBULATORY_CARE_PROVIDER_SITE_OTHER): Payer: BC Managed Care – PPO | Admitting: Family Medicine

## 2013-06-01 VITALS — BP 100/70 | HR 60 | Temp 98.0°F | Ht 65.25 in | Wt 122.0 lb

## 2013-06-01 DIAGNOSIS — M79609 Pain in unspecified limb: Secondary | ICD-10-CM

## 2013-06-01 DIAGNOSIS — E039 Hypothyroidism, unspecified: Secondary | ICD-10-CM

## 2013-06-01 DIAGNOSIS — M79605 Pain in left leg: Secondary | ICD-10-CM | POA: Insufficient documentation

## 2013-06-01 MED ORDER — LEVOTHYROXINE SODIUM 150 MCG PO TABS: ORAL_TABLET | ORAL | Status: DC

## 2013-06-01 NOTE — Patient Instructions (Addendum)
Look into tetanus.. When it was last given? Push fluids. If any swelling, redness and recurrent pain in leg.. Call for further evaluation.

## 2013-06-01 NOTE — Assessment & Plan Note (Signed)
No sign of DVT.  Most liekly cramps.. Increase water, stretch after running.

## 2013-06-01 NOTE — Assessment & Plan Note (Signed)
Well controlled. Continue current medication.  

## 2013-06-01 NOTE — Progress Notes (Signed)
Subjective:    Patient ID: Sheena Harris, female    DOB: 12/30/1985, 27 y.o.   MRN: [id number]  HPI   Hypothyroidism Sheena Harris is a 27 y.o. female who presents for follow up of hypothyroidism. Current symptoms: none . Patient denies change in energy level, diarrhea, heat / cold intolerance, nervousness, palpitations and weight changes. Symptoms have been well-controlled.  Lab Results  Component Value Date   TSH 1.21 05/25/2013    Regular exercise, healthy eating.   Has noted few days of  intermittent pain in left leg 3 days in last week. Tender to touch, no swelling, no redness. Did not only happen on days she exercise.  She is on OCPs.   Review of Systems  Constitutional: Negative for fever, fatigue and unexpected weight change.  HENT: Negative for ear pain, congestion, sore throat, sneezing, trouble swallowing and sinus pressure.   Eyes: Negative for pain and itching.  Respiratory: Negative for cough, shortness of breath and wheezing.   Cardiovascular: Negative for chest pain, palpitations and leg swelling.  Gastrointestinal: Negative for nausea, abdominal pain, diarrhea, constipation and blood in stool.  Genitourinary: Negative for dysuria, hematuria, vaginal discharge, difficulty urinating and menstrual problem.  Skin: Negative for rash.  Neurological: Negative for syncope, weakness, light-headedness, numbness and headaches.  Psychiatric/Behavioral: Negative for confusion and dysphoric mood. The patient is not nervous/anxious.        Objective:   Physical Exam  Constitutional: Vital signs are normal. She appears well-developed and well-nourished. She is cooperative.  Non-toxic appearance. She does not appear ill. No distress.  HENT:  Head: Normocephalic.  Right Ear: Hearing, tympanic membrane, external ear and ear canal normal. Tympanic membrane is not erythematous, not retracted and not bulging.  Left Ear: Hearing, tympanic membrane, external ear and ear canal normal.  Tympanic membrane is not erythematous, not retracted and not bulging.  Nose: No mucosal edema or rhinorrhea. Right sinus exhibits no maxillary sinus tenderness and no frontal sinus tenderness. Left sinus exhibits no maxillary sinus tenderness and no frontal sinus tenderness.  Mouth/Throat: Uvula is midline, oropharynx is clear and moist and mucous membranes are normal.  Eyes: Conjunctivae, EOM and lids are normal. Pupils are equal, round, and reactive to light. Lids are everted and swept, no foreign bodies found.  Neck: Trachea normal and normal range of motion. Neck supple. Carotid bruit is not present. No mass and no thyromegaly present.  Cardiovascular: Normal rate, regular rhythm, S1 normal, S2 normal, normal heart sounds, intact distal pulses and normal pulses.  Exam reveals no gallop and no friction rub.   No murmur heard. Pulmonary/Chest: Effort normal and breath sounds normal. Not tachypneic. No respiratory distress. She has no decreased breath sounds. She has no wheezes. She has no rhonchi. She has no rales.  Abdominal: Soft. Normal appearance and bowel sounds are normal. There is no tenderness.  Neurological: She is alert.  Skin: Skin is warm, dry and intact. No rash noted.  No swelling, no varicose veins B legs.  Psychiatric: Her speech is normal and behavior is normal. Judgment and thought content normal. Her mood appears not anxious. Cognition and memory are normal. She does not exhibit a depressed mood.          Assessment & Plan:  The patient's preventative maintenance and recommended screening tests for an annual wellness exam were reviewed in full today. Brought up to date unless services declined.  Counselled on the importance of diet, exercise, and its role in overall health and mortality. The  patient's FH and SH was reviewed, including their home life, tobacco status, and drug and alcohol status.   Vaccines: HPV, last td ?   PAP/DVE: nml in 07/2012

## 2013-08-25 ENCOUNTER — Other Ambulatory Visit: Payer: Self-pay

## 2013-12-28 ENCOUNTER — Encounter: Payer: Self-pay | Admitting: Family Medicine

## 2014-01-16 ENCOUNTER — Telehealth: Payer: Self-pay | Admitting: Family Medicine

## 2014-01-16 DIAGNOSIS — E039 Hypothyroidism, unspecified: Secondary | ICD-10-CM

## 2014-01-16 NOTE — Telephone Encounter (Signed)
Message copied by Jinny Sanders on Mon Jan 16, 2014 11:23 PM ------      Message from: Ellamae Sia      Created: Mon Jan 09, 2014 12:39 PM      Regarding: Lab orders for Tuesday, 3.31.15       Lab orders, no f/u appt ------

## 2014-01-17 ENCOUNTER — Other Ambulatory Visit (INDEPENDENT_AMBULATORY_CARE_PROVIDER_SITE_OTHER): Payer: BC Managed Care – PPO

## 2014-01-17 DIAGNOSIS — E039 Hypothyroidism, unspecified: Secondary | ICD-10-CM

## 2014-01-17 LAB — T3, FREE: T3, Free: 2.8 pg/mL (ref 2.3–4.2)

## 2014-01-17 LAB — T4, FREE: Free T4: 0.77 ng/dL (ref 0.60–1.60)

## 2014-01-17 LAB — TSH: TSH: 1.82 u[IU]/mL (ref 0.35–5.50)

## 2014-07-24 ENCOUNTER — Other Ambulatory Visit: Payer: Self-pay | Admitting: Family Medicine

## 2014-07-24 DIAGNOSIS — E039 Hypothyroidism, unspecified: Secondary | ICD-10-CM

## 2014-07-26 ENCOUNTER — Other Ambulatory Visit (INDEPENDENT_AMBULATORY_CARE_PROVIDER_SITE_OTHER): Payer: BC Managed Care – PPO

## 2014-07-26 DIAGNOSIS — E039 Hypothyroidism, unspecified: Secondary | ICD-10-CM

## 2014-07-26 LAB — LIPID PANEL
CHOL/HDL RATIO: 3
Cholesterol: 161 mg/dL (ref 0–200)
HDL: 49.1 mg/dL (ref >39.00)
LDL CALC: 102 mg/dL — AB (ref 0–99)
NonHDL: 111.9
Triglycerides: 50 mg/dL (ref 0.0–149.0)
VLDL: 10 mg/dL (ref 0.0–40.0)

## 2014-07-26 LAB — BASIC METABOLIC PANEL
BUN: 12 mg/dL (ref 6–23)
CALCIUM: 8.8 mg/dL (ref 8.4–10.5)
CO2: 25 mEq/L (ref 19–32)
Chloride: 106 mEq/L (ref 96–112)
Creatinine, Ser: 0.6 mg/dL (ref 0.4–1.2)
GFR: 117.66 mL/min (ref >60.00)
GLUCOSE: 82 mg/dL (ref 70–99)
POTASSIUM: 4.3 meq/L (ref 3.5–5.1)
SODIUM: 137 meq/L (ref 135–145)

## 2014-07-26 LAB — T4, FREE: FREE T4: 1.01 ng/dL (ref 0.60–1.60)

## 2014-07-26 LAB — TSH: TSH: 0.85 u[IU]/mL (ref 0.35–4.50)

## 2014-07-28 ENCOUNTER — Telehealth: Payer: Self-pay | Admitting: Family Medicine

## 2014-07-28 ENCOUNTER — Encounter: Payer: Self-pay | Admitting: Family Medicine

## 2014-07-28 ENCOUNTER — Ambulatory Visit (INDEPENDENT_AMBULATORY_CARE_PROVIDER_SITE_OTHER): Payer: BC Managed Care – PPO | Admitting: Family Medicine

## 2014-07-28 VITALS — BP 114/78 | HR 72 | Temp 98.0°F | Ht 65.0 in | Wt 127.2 lb

## 2014-07-28 DIAGNOSIS — Z9889 Other specified postprocedural states: Secondary | ICD-10-CM

## 2014-07-28 DIAGNOSIS — E032 Hypothyroidism due to medicaments and other exogenous substances: Secondary | ICD-10-CM

## 2014-07-28 DIAGNOSIS — Z923 Personal history of irradiation: Secondary | ICD-10-CM

## 2014-07-28 DIAGNOSIS — Z23 Encounter for immunization: Secondary | ICD-10-CM

## 2014-07-28 DIAGNOSIS — Z0000 Encounter for general adult medical examination without abnormal findings: Secondary | ICD-10-CM | POA: Insufficient documentation

## 2014-07-28 MED ORDER — LEVOTHYROXINE SODIUM 150 MCG PO TABS: ORAL_TABLET | ORAL | Status: DC

## 2014-07-28 NOTE — Addendum Note (Signed)
Addended by: Royann Shivers A on: 07/28/2014 05:04 PM   Modules accepted: Orders

## 2014-07-28 NOTE — Patient Instructions (Signed)
I think you are doing well today. Tdap and flu shot today. Good to meet you today, call us with questions. Return as needed or in 1 year for next visit.

## 2014-07-28 NOTE — Assessment & Plan Note (Signed)
Preventative protocols reviewed and updated unless pt declined. Discussed healthy diet and lifestyle.  

## 2014-07-28 NOTE — Progress Notes (Signed)
BP 114/78  Pulse 72  Temp(Src) 98 F (36.7 C) (Oral)  Ht 5\' 5"  (1.651 m)  Wt 127 lb 4 oz (57.72 kg)  BMI 21.18 kg/m2  LMP 07/14/2014   CC: transfer of care  Subjective:    Patient ID: Sheena Harris, female    DOB: 04-03-1986, 28 y.o.   MRN: [id number]  HPI: Aleesha Ringstad is a 28 y.o. female presenting on 07/28/2014 for Establish Care   Transfer from Dr. Diona Browner.  Hypothyroid - labwork normal. Noticing some night sweats (worse during week of period), fatigue, cold intolerance. No unexpected weight, skin or hair changes, concentration difficulties, diarrhea/constipation.  Preventative: Well woman with OBGYN, OCP through their office. 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  Allergies and medications reviewed and updated. Current Outpatient Prescriptions on File Prior to Visit  Medication Sig  . cholecalciferol (VITAMIN D) 1000 UNITS tablet Take 1,000 Units by mouth daily.  Lenard Forth Triphasic (ORTHO TRI-CYCLEN, 28,) 0.18/0.215/0.25 MG-35 MCG TABS Take 1 tablet by mouth daily.    . vitamin B-12 (CYANOCOBALAMIN) 1000 MCG tablet Take 1,000 mcg by mouth daily.   No current facility-administered medications on file prior to visit.    Review of Systems  Constitutional: Negative for fever, chills, activity change, appetite change, fatigue and unexpected weight change.  HENT: Negative for hearing loss.   Eyes: Negative for visual disturbance.  Respiratory: Negative for cough, chest tightness, shortness of breath and wheezing.   Cardiovascular: Negative for chest pain, palpitations and leg swelling.  Gastrointestinal: Negative for nausea, vomiting, abdominal pain, diarrhea, constipation, blood in stool and abdominal distention.    Genitourinary: Negative for hematuria and difficulty urinating.  Musculoskeletal: Negative for arthralgias, myalgias and neck pain.  Skin: Negative for rash.  Neurological: Negative for dizziness, seizures, syncope and headaches.  Hematological: Negative for adenopathy. Does not bruise/bleed easily.  Psychiatric/Behavioral: Negative for dysphoric mood. The patient is not nervous/anxious.    Per HPI unless specifically indicated above    Objective:    BP 114/78  Pulse 72  Temp(Src) 98 F (36.7 C) (Oral)  Ht 5\' 5"  (1.651 m)  Wt 127 lb 4 oz (57.72 kg)  BMI 21.18 kg/m2  LMP 07/14/2014  Physical Exam  Nursing note and vitals reviewed. Constitutional: She is oriented to person, place, and time. She appears well-developed and well-nourished. No distress.  HENT:  Head: Normocephalic and atraumatic.  Right Ear: Hearing, tympanic membrane, external ear and ear canal normal.  Left Ear: Hearing, tympanic membrane, external ear and ear canal normal.  Nose: Nose normal.  Mouth/Throat: Uvula is midline, oropharynx is clear and moist and mucous membranes are normal. No oropharyngeal exudate, posterior oropharyngeal edema or posterior oropharyngeal erythema.  Eyes: Conjunctivae and EOM are normal. Pupils are equal, round, and reactive to light. No scleral icterus.  Neck: Normal range of motion. Neck supple. No thyromegaly present.  Cardiovascular: Normal rate, regular rhythm, normal heart sounds and intact distal pulses.   No murmur heard. Pulses:      Radial pulses are 2+ on the right side, and 2+ on the left side.  Pulmonary/Chest: Effort normal and breath sounds normal. No respiratory distress. She has no wheezes. She has no rales.  Musculoskeletal: Normal range of motion. She exhibits no edema.  Lymphadenopathy:  She has no cervical adenopathy.  Neurological: She is alert and oriented to person, place, and time.  CN grossly intact, station and gait intact  Skin: Skin is warm and dry.  No rash noted.  Psychiatric: She has a normal mood and affect. Her behavior is normal. Judgment and thought content normal.   Results for orders placed in visit on 07/26/14  LIPID PANEL      Result Value Ref Range   Cholesterol 161  0 - 200 mg/dL   Triglycerides 50.0  0.0 - 149.0 mg/dL   HDL 49.10  >39.00 mg/dL   VLDL 10.0  0.0 - 40.0 mg/dL   LDL Cholesterol 102 (*) 0 - 99 mg/dL   Total CHOL/HDL Ratio 3     NonHDL 833.82    BASIC METABOLIC PANEL      Result Value Ref Range   Sodium 137  135 - 145 mEq/L   Potassium 4.3  3.5 - 5.1 mEq/L   Chloride 106  96 - 112 mEq/L   CO2 25  19 - 32 mEq/L   Glucose, Bld 82  70 - 99 mg/dL   BUN 12  6 - 23 mg/dL   Creatinine, Ser 0.6  0.4 - 1.2 mg/dL   Calcium 8.8  8.4 - 10.5 mg/dL   GFR 117.66  >60.00 mL/min  TSH      Result Value Ref Range   TSH 0.85  0.35 - 4.50 uIU/mL  T4, FREE      Result Value Ref Range   Free T4 1.01  0.60 - 1.60 ng/dL      Assessment & Plan:   Problem List Items Addressed This Visit   Hypothyroidism     Controlled, continue current dose.    Relevant Medications      levothyroxine (SYNTHROID, LEVOTHROID) tablet   Health care maintenance - Primary     Preventative protocols reviewed and updated unless pt declined. Discussed healthy diet and lifestyle.     H/O radioactive iodine thyroid ablation       Follow up plan: Return as needed, for annual exam, prior fasting for blood work.

## 2014-07-28 NOTE — Assessment & Plan Note (Signed)
Controlled, continue current dose.

## 2014-07-28 NOTE — Telephone Encounter (Signed)
Pt requested to switch to me 2/2 ease of access. Also pt's fiance and his family is my patient. Will route to Dr. B to ensure ok with her.

## 2014-07-28 NOTE — Progress Notes (Signed)
Pre visit review using our clinic review tool, if applicable. No additional management support is needed unless otherwise documented below in the visit note. 

## 2014-07-29 NOTE — Telephone Encounter (Signed)
No problem.

## 2015-01-15 ENCOUNTER — Telehealth: Payer: Self-pay | Admitting: Family

## 2015-01-15 DIAGNOSIS — A049 Bacterial intestinal infection, unspecified: Secondary | ICD-10-CM

## 2015-01-15 MED ORDER — CIPROFLOXACIN HCL 500 MG PO TABS: 500.0000 mg | ORAL_TABLET | Freq: Two times a day (BID) | ORAL | Status: DC

## 2015-01-15 NOTE — Progress Notes (Signed)
We are sorry that you are not feeling well.  Here is how we plan to help!  Based on what you have shared with me it looks like you have Acute Infectious Diarrhea.  Most cases of acute diarrhea are due to infections with virus and bacteria and are self-limited conditions lasting less than 14 days.  For your symptoms you may take Imodium 2 mg tablets that are over the counter at your local pharmacy. Take two tablet now and then one after each loose stool up to 6 a day.  Antibiotics are not needed for most people with diarrhea.  Optional: I have sent in Cipro 500 mg two tablets twice a day for five days. If you do not improve with this regimen, please be seen face-to-face.   HOME CARE  We recommend changing your diet to help with your symptoms for the next few days.  Drink plenty of fluids that contain water salt and sugar. Sports drinks such as Gatorade may help.   You may try broths, soups, bananas, applesauce, soft breads, mashed potatoes or crackers.   You are considered infectious for as long as the diarrhea continues. Hand washing or use of alcohol based hand sanitizers is recommend.  It is best to stay out of work or school until your symptoms stop.   GET HELP RIGHT AWAY  If you have dark yellow colored urine or do not pass urine frequently you should drink more fluids.    If your symptoms worsen   If you feel like you are going to pass out (faint)  You have a new problem  MAKE SURE YOU   Understand these instructions.  Will watch your condition.  Will get help right away if you are not doing well or get worse.  Your e-visit answers were reviewed by a board certified advanced clinical practitioner to complete your personal care plan.  Depending on the condition, your plan could have included both over the counter or prescription medications.  If there is a problem please reply  once you have received a response from your provider.  Your safety is important to [place].  If  you have drug allergies check your prescription carefully.    You can use MyChart to ask questions about today's visit, request a non-urgent call back, or ask for a work or school excuse.  You will get an e-mail in the next two days asking about your experience.  I hope that your e-visit has been valuable and will speed your recovery. Thank you for using e-visits.

## 2015-01-16 ENCOUNTER — Telehealth: Payer: Self-pay

## 2015-01-16 NOTE — Telephone Encounter (Signed)
Pt request status of abx; spoke with [facility] and rx ready for pick up.pt will go to p harmacy/

## 2015-06-20 ENCOUNTER — Encounter: Payer: Self-pay | Admitting: Primary Care

## 2015-06-20 ENCOUNTER — Ambulatory Visit (INDEPENDENT_AMBULATORY_CARE_PROVIDER_SITE_OTHER): Payer: BLUE CROSS/BLUE SHIELD | Admitting: Primary Care

## 2015-06-20 VITALS — BP 110/62 | HR 73 | Temp 98.2°F | Ht 65.0 in | Wt 126.0 lb

## 2015-06-20 DIAGNOSIS — R21 Rash and other nonspecific skin eruption: Secondary | ICD-10-CM

## 2015-06-20 MED ORDER — CICLOPIROX OLAMINE 0.77 % EX CREA: TOPICAL_CREAM | Freq: Two times a day (BID) | CUTANEOUS | Status: DC

## 2015-06-20 MED ORDER — PREDNISONE 20 MG PO TABS: ORAL_TABLET | ORAL | Status: DC

## 2015-06-20 NOTE — Patient Instructions (Signed)
Start Prednisone steroids for rash. Take 3 tablets by mouth for 3 days, then 2 tablets for 3 days, then 1 tablet for 3 days.  Apply Ciclopirox cream twice daily to affected area. Do not use for more than 4 weeks.  It was a pleasure meeting you!

## 2015-06-20 NOTE — Progress Notes (Signed)
Subjective:    Patient ID: Sheena Harris, female    DOB: 07-09-1986, 29 y.o.   MRN: [id number]  HPI  Sheena Harris is a 29 year old female who presents today with a chief complaint of rash. Her rash is present to bilateral axilla, the right lower extremity, and face. She was in the yard working about 2 weekends ago. Her rash first started to the right lower extremity shortly after working in the yard and over the next several days progressed to her bilateral axilla. She then noticed the rash to her face 2 days ago which has improved. She's taken Benadryl, [facility] brand of Allegra, applied hydrocortisone cream without much relief. The rash does itch to her right lower extremity and face, but does not itch to her axilla. Denies changes in laundry detergents, soaps, or the addition of any new foods or chemicals.  Review of Systems  Constitutional: Negative for fever, chills and fatigue.  Respiratory: Negative for shortness of breath.   Cardiovascular: Negative for chest pain.  Musculoskeletal: Negative for arthralgias.  Skin: Positive for rash.  Neurological: Negative for dizziness, weakness and headaches.       Past Medical History  Diagnosis Date  . Hypothyroidism     s/p radioactive iodine therapy for hyperthyroidism  . H/O radioactive iodine thyroid ablation     Social History   Social History  . Marital Status: Married    Spouse Name: N/A  . Number of Children: N/A  . Years of Education: N/A   Occupational History  . 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 Smoking status: Never Smoker   . Smokeless tobacco: Never Used  . Alcohol Use: Yes     Comment: occasionally  . Drug Use: No  . Sexual Activity: Not on file   Other Topics Concern  . Not on file   Social History Narrative   Lives with fiance Sheena Harris   Occupation: PE teacher - [employer]   Edu: Sheena Harris   Activity: works out 5d/wk   Diet: good water, fruits/vegetables daily     Past Surgical History  Procedure Laterality Date  . Knee arthroscopy Right 2002, 2003    meniscal surgery x2  . Nasal septum surgery  2002    Family History  Problem Relation Age of Onset  . Epilepsy Father   . Cancer Mother 84    breast, s/p mastectomy, BRCA negative  . Cancer Other     breast, maternal aunt  . Cancer Paternal Grandfather     smoker  . Stroke Maternal Grandfather 45    nonsmoker  . Hypertension Mother   . Diabetes Neg Hx   . CAD Neg Hx   . Hyperthyroidism Mother     Allergies  Allergen Reactions  . Codeine Nausea Only    Current Outpatient Prescriptions on File Prior to Visit  Medication Sig Dispense Refill  . cholecalciferol (VITAMIN D) 1000 UNITS tablet Take 1,000 Units by mouth daily.    . ciprofloxacin (CIPRO) 500 MG tablet Take 1 tablet (500 mg total) by mouth 2 (two) times daily. 10 tablet 0  . levothyroxine (SYNTHROID, LEVOTHROID) 150 MCG tablet TAKE ONE TABLET BY MOUTH EVERY DAY 90 tablet 3  . Norgestim-Eth Estrad Triphasic (ORTHO TRI-CYCLEN, 28,) 0.18/0.215/0.25 MG-35 MCG TABS Take 1 tablet by mouth daily.      . vitamin B-12 (CYANOCOBALAMIN) 1000 MCG tablet Take 1,000 mcg by mouth daily.  No current facility-administered medications on file prior to visit.    BP 110/62 mmHg  Pulse 73  Temp(Src) 98.2 F (36.8 C) (Oral)  Ht _0  (1.651 m)  Wt 126 lb (57.153 kg)  BMI 20.97 kg/m2  SpO2 99%    Objective:   Physical Exam  Constitutional: She appears well-nourished.  Cardiovascular: Normal rate and regular rhythm.   Pulmonary/Chest: Effort normal and breath sounds normal.  Skin: Skin is warm and dry. Rash noted.  2 different types of rash present, both moderately present. Rash to axilla representing poison ivy dermatitis, rash to right lower extremity mixed with fungal appearing spots. No open wounds or drainage noted. No bulls eye rash.          Assessment & Plan:  Rash:  Appears to be poison ivy dermatitis mixed with  a fungal rash. Some itching. Present to right lower extremity, bilateral axilla, sparingly to right side of face. Will treat with Prednisone taper and ciclopirox cream. Follow up PRN if no improvement in symptoms.

## 2015-06-20 NOTE — Progress Notes (Signed)
Pre visit review using our clinic review tool, if applicable. No additional management support is needed unless otherwise documented below in the visit note. 

## 2015-07-19 ENCOUNTER — Other Ambulatory Visit (INDEPENDENT_AMBULATORY_CARE_PROVIDER_SITE_OTHER): Payer: BLUE CROSS/BLUE SHIELD

## 2015-07-19 ENCOUNTER — Other Ambulatory Visit: Payer: Self-pay | Admitting: Family Medicine

## 2015-07-19 DIAGNOSIS — E032 Hypothyroidism due to medicaments and other exogenous substances: Secondary | ICD-10-CM

## 2015-07-19 LAB — BASIC METABOLIC PANEL
BUN: 11 mg/dL (ref 6–23)
CHLORIDE: 107 meq/L (ref 96–112)
CO2: 27 meq/L (ref 19–32)
Calcium: 9 mg/dL (ref 8.4–10.5)
Creatinine, Ser: 0.63 mg/dL (ref 0.40–1.20)
GFR: 116.91 mL/min (ref >60.00)
Glucose, Bld: 84 mg/dL (ref 70–99)
POTASSIUM: 4.4 meq/L (ref 3.5–5.1)
Sodium: 139 mEq/L (ref 135–145)

## 2015-07-19 LAB — TSH: TSH: 1.5 u[IU]/mL (ref 0.35–4.50)

## 2015-07-24 ENCOUNTER — Ambulatory Visit (INDEPENDENT_AMBULATORY_CARE_PROVIDER_SITE_OTHER): Payer: BLUE CROSS/BLUE SHIELD | Admitting: Family Medicine

## 2015-07-24 ENCOUNTER — Encounter: Payer: Self-pay | Admitting: Family Medicine

## 2015-07-24 VITALS — BP 112/76 | HR 64 | Temp 98.0°F | Ht 65.0 in | Wt 128.5 lb

## 2015-07-24 DIAGNOSIS — Z0000 Encounter for general adult medical examination without abnormal findings: Secondary | ICD-10-CM

## 2015-07-24 DIAGNOSIS — Z23 Encounter for immunization: Secondary | ICD-10-CM

## 2015-07-24 DIAGNOSIS — E032 Hypothyroidism due to medicaments and other exogenous substances: Secondary | ICD-10-CM

## 2015-07-24 MED ORDER — LEVOTHYROXINE SODIUM 150 MCG PO TABS: 150.0000 ug | ORAL_TABLET | Freq: Every day | ORAL | Status: DC

## 2015-07-24 NOTE — Patient Instructions (Signed)
Flu shot today Return as needed or in 1 year for next physical/med refill  Health Maintenance Adopting a healthy lifestyle and getting preventive care can go a long way to promote health and wellness. Talk with your health care provider about what schedule of regular examinations is right for you. This is a good chance for you to check in with your provider about disease prevention and staying healthy. In between checkups, there are plenty of things you can do on your own. Experts have done a lot of research about which lifestyle changes and preventive measures are most likely to keep you healthy. Ask your health care provider for more information. WEIGHT AND DIET  Eat a healthy diet  Be sure to include plenty of vegetables, fruits, low-fat dairy products, and lean protein.  Do not eat a lot of foods high in solid fats, added sugars, or salt.  Get regular exercise. This is one of the most important things you can do for your health.  Most adults should exercise for at least 150 minutes each week. The exercise should increase your heart rate and make you sweat (moderate-intensity exercise).  Most adults should also do strengthening exercises at least twice a week. This is in addition to the moderate-intensity exercise.  Maintain a healthy weight  Body mass index (BMI) is a measurement that can be used to identify possible weight problems. It estimates body fat based on height and weight. Your health care provider can help determine your BMI and help you achieve or maintain a healthy weight.  For females 52 years of age and older:   A BMI below 18.5 is considered underweight.  A BMI of 18.5 to 24.9 is normal.  A BMI of 25 to 29.9 is considered overweight.  A BMI of 30 and above is considered obese.  Watch levels of cholesterol and blood lipids  You should start having your blood tested for lipids and cholesterol at 31 years of age, then have this test every 5 years.  You may need  to have your cholesterol levels checked more often if:  Your lipid or cholesterol levels are high.  You are older than 31 years of age.  You are at high risk for heart disease.  CANCER SCREENING   Lung Cancer  Lung cancer screening is recommended for adults 47-73 years old who are at high risk for lung cancer because of a history of smoking.  A yearly low-dose CT scan of the lungs is recommended for people who:  Currently smoke.  Have quit within the past 15 years.  Have at least a 30-pack-year history of smoking. A pack year is smoking an average of one pack of cigarettes a day for 1 year.  Yearly screening should continue until it has been 15 years since you quit.  Yearly screening should stop if you develop a health problem that would prevent you from having lung cancer treatment.  Breast Cancer  Practice breast self-awareness. This means understanding how your breasts normally appear and feel.  It also means doing regular breast self-exams. Let your health care provider know about any changes, no matter how small.  If you are in your 20s or 30s, you should have a clinical breast exam (CBE) by a health care provider every 1-3 years as part of a regular health exam.  If you are 32 or older, have a CBE every year. Also consider having a breast X-ray (mammogram) every year.  If you have a family history of  breast cancer, talk to your health care provider about genetic screening.  If you are at high risk for breast cancer, talk to your health care provider about having an MRI and a mammogram every year.  Breast cancer gene (BRCA) assessment is recommended for women who have family members with BRCA-related cancers. BRCA-related cancers include:  Breast.  Ovarian.  Tubal.  Peritoneal cancers.  Results of the assessment will determine the need for genetic counseling and BRCA1 and BRCA2 testing. Cervical Cancer Routine pelvic examinations to screen for cervical cancer  are no longer recommended for nonpregnant women who are considered low risk for cancer of the pelvic organs (ovaries, uterus, and vagina) and who do not have symptoms. A pelvic examination may be necessary if you have symptoms including those associated with pelvic infections. Ask your health care provider if a screening pelvic exam is right for you.   The Pap test is the screening test for cervical cancer for women who are considered at risk.  If you had a hysterectomy for a problem that was not cancer or a condition that could lead to cancer, then you no longer need Pap tests.  If you are older than 65 years, and you have had normal Pap tests for the past 10 years, you no longer need to have Pap tests.  If you have had past treatment for cervical cancer or a condition that could lead to cancer, you need Pap tests and screening for cancer for at least 20 years after your treatment.  If you no longer get a Pap test, assess your risk factors if they change (such as having a new sexual partner). This can affect whether you should start being screened again.  Some women have medical problems that increase their chance of getting cervical cancer. If this is the case for you, your health care provider may recommend more frequent screening and Pap tests.  The human papillomavirus (HPV) test is another test that may be used for cervical cancer screening. The HPV test looks for the virus that can cause cell changes in the cervix. The cells collected during the Pap test can be tested for HPV.  The HPV test can be used to screen women 54 years of age and older. Getting tested for HPV can extend the interval between normal Pap tests from three to five years.  An HPV test also should be used to screen women of any age who have unclear Pap test results.  After 31 years of age, women should have HPV testing as often as Pap tests.  Colorectal Cancer  This type of cancer can be detected and often  prevented.  Routine colorectal cancer screening usually begins at 31 years of age and continues through 31 years of age.  Your health care provider may recommend screening at an earlier age if you have risk factors for colon cancer.  Your health care provider may also recommend using home test kits to check for hidden blood in the stool.  A small camera at the end of a tube can be used to examine your colon directly (sigmoidoscopy or colonoscopy). This is done to check for the earliest forms of colorectal cancer.  Routine screening usually begins at age 36.  Direct examination of the colon should be repeated every 5-10 years through 31 years of age. However, you may need to be screened more often if early forms of precancerous polyps or small growths are found. Skin Cancer  Check your skin from head to  toe regularly.  Tell your health care provider about any new moles or changes in moles, especially if there is a change in a mole's shape or color.  Also tell your health care provider if you have a mole that is larger than the size of a pencil eraser.  Always use sunscreen. Apply sunscreen liberally and repeatedly throughout the day.  Protect yourself by wearing long sleeves, pants, a wide-brimmed hat, and sunglasses whenever you are outside. HEART DISEASE, DIABETES, AND HIGH BLOOD PRESSURE   Have your blood pressure checked at least every 1-2 years. High blood pressure causes heart disease and increases the risk of stroke.  If you are between 39 years and 24 years old, ask your health care provider if you should take aspirin to prevent strokes.  Have regular diabetes screenings. This involves taking a blood sample to check your fasting blood sugar level.  If you are at a normal weight and have a low risk for diabetes, have this test once every three years after 31 years of age.  If you are overweight and have a high risk for diabetes, consider being tested at a younger age or more  often. PREVENTING INFECTION  Hepatitis B  If you have a higher risk for hepatitis B, you should be screened for this virus. You are considered at high risk for hepatitis B if:  You were born in a country where hepatitis B is common. Ask your health care provider which countries are considered high risk.  Your parents were born in a high-risk country, and you have not been immunized against hepatitis B (hepatitis B vaccine).  You have HIV or AIDS.  You use needles to inject street drugs.  You live with someone who has hepatitis B.  You have had sex with someone who has hepatitis B.  You get hemodialysis treatment.  You take certain medicines for conditions, including cancer, organ transplantation, and autoimmune conditions. Hepatitis C  Blood testing is recommended for:  Everyone born from [birth year] through 1967.  Anyone with known risk factors for hepatitis C. Sexually transmitted infections (STIs)  You should be screened for sexually transmitted infections (STIs) including gonorrhea and chlamydia if:  You are sexually active and are younger than 31 years of age.  You are older than 31 years of age and your health care provider tells you that you are at risk for this type of infection.  Your sexual activity has changed since you were last screened and you are at an increased risk for chlamydia or gonorrhea. Ask your health care provider if you are at risk.  If you do not have HIV, but are at risk, it may be recommended that you take a prescription medicine daily to prevent HIV infection. This is called pre-exposure prophylaxis (PrEP). You are considered at risk if:  You are sexually active and do not regularly use condoms or know the HIV status of your partner(s).  You take drugs by injection.  You are sexually active with a partner who has HIV. Talk with your health care provider about whether you are at high risk of being infected with HIV. If you choose to begin PrEP, you  should first be tested for HIV. You should then be tested every 3 months for as long as you are taking PrEP.  PREGNANCY   If you are premenopausal and you may become pregnant, ask your health care provider about preconception counseling.  If you may become pregnant, take 400 to 800 micrograms (mcg)  of folic acid every day.  If you want to prevent pregnancy, talk to your health care provider about birth control (contraception). OSTEOPOROSIS AND MENOPAUSE   Osteoporosis is a disease in which the bones lose minerals and strength with aging. This can result in serious bone fractures. Your risk for osteoporosis can be identified using a bone density scan.  If you are 68 years of age or older, or if you are at risk for osteoporosis and fractures, ask your health care provider if you should be screened.  Ask your health care provider whether you should take a calcium or vitamin D supplement to lower your risk for osteoporosis.  Menopause may have certain physical symptoms and risks.  Hormone replacement therapy may reduce some of these symptoms and risks. Talk to your health care provider about whether hormone replacement therapy is right for you.  HOME CARE INSTRUCTIONS   Schedule regular health, dental, and eye exams.  Stay current with your immunizations.   Do not use any tobacco products including cigarettes, chewing tobacco, or electronic cigarettes.  If you are pregnant, do not drink alcohol.  If you are breastfeeding, limit how much and how often you drink alcohol.  Limit alcohol intake to no more than 1 drink per day for nonpregnant women. One drink equals 12 ounces of beer, 5 ounces of wine, or 1 ounces of hard liquor.  Do not use street drugs.  Do not share needles.  Ask your health care provider for help if you need support or information about quitting drugs.  Tell your health care provider if you often feel depressed.  Tell your health care provider if you have ever  been abused or do not feel safe at home. Document Released: 04/21/2011 Document Revised: 02/20/2014 Document Reviewed: 09/07/2013 [facility] Patient Information 2015 [place], [place]. This information is not intended to replace advice given to you by your health care provider. Make sure you discuss any questions you have with your health care provider.

## 2015-07-24 NOTE — Assessment & Plan Note (Signed)
TSH stable. Continue current regimen.

## 2015-07-24 NOTE — Progress Notes (Signed)
BP 112/76 mmHg  Pulse 64  Temp(Src) 98 F (36.7 C) (Oral)  Ht 5\' 5"  (1.651 m)  Wt 128 lb 8 oz (58.287 kg)  BMI 21.38 kg/m2  LMP 07/17/2015   CC: CPE  Subjective:    Patient ID: Sheena Harris, female    DOB: 04-16-1986, 29 y.o.   MRN: [id number]  HPI: Sheena Harris is a 29 y.o. female presenting on 07/24/2015 for Annual Exam   Hypothyroid - labwork normal.  Preventative: Well woman with OBGYN at [facility], OCP through their office. Normal pap smears. Flu - today Tdap - today 07/2014 Seat belt use discussed Sunscreen use discussed. No changing moles on skin. Sees derm regularly.   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 Interim medical history since our last visit reviewed. Allergies and medications reviewed and updated. Current Outpatient Prescriptions on File Prior to Visit  Medication Sig  . cholecalciferol (VITAMIN D) 1000 UNITS tablet Take 1,000 Units by mouth daily.  Lenard Forth Triphasic (ORTHO TRI-CYCLEN, 28,) 0.18/0.215/0.25 MG-35 MCG TABS Take 1 tablet by mouth daily.    . vitamin B-12 (CYANOCOBALAMIN) 1000 MCG tablet Take 1,000 mcg by mouth daily.   No current facility-administered medications on file prior to visit.    Review of Systems  Constitutional: Negative for fever, chills, activity change, appetite change, fatigue and unexpected weight change.  HENT: Negative for hearing loss.   Eyes: Negative for visual disturbance.  Respiratory: Negative for cough, chest tightness, shortness of breath and wheezing.   Cardiovascular: Negative for chest pain, palpitations and leg swelling.  Gastrointestinal: Negative for nausea, vomiting, abdominal pain, diarrhea, constipation, blood in stool and abdominal  distention.  Genitourinary: Negative for hematuria and difficulty urinating.  Musculoskeletal: Negative for myalgias, arthralgias and neck pain.  Skin: Negative for rash.  Neurological: Negative for dizziness, seizures, syncope and headaches.  Hematological: Negative for adenopathy. Does not bruise/bleed easily.  Psychiatric/Behavioral: Negative for dysphoric mood. The patient is not nervous/anxious.    Per HPI unless specifically indicated above     Objective:    BP 112/76 mmHg  Pulse 64  Temp(Src) 98 F (36.7 C) (Oral)  Ht 5\' 5"  (1.651 m)  Wt 128 lb 8 oz (58.287 kg)  BMI 21.38 kg/m2  LMP 07/17/2015  Wt Readings from Last 3 Encounters:  07/24/15 128 lb 8 oz (58.287 kg)  06/20/15 126 lb (57.153 kg)  07/28/14 127 lb 4 oz (57.72 kg)    Physical Exam  Constitutional: She is oriented to person, place, and time. She appears well-developed and well-nourished. No distress.  HENT:  Head: Normocephalic and atraumatic.  Right Ear: Hearing, tympanic membrane, external ear and ear canal normal.  Left Ear: Hearing, tympanic membrane, external ear and ear canal normal.  Nose: Nose normal.  Mouth/Throat: Uvula is midline, oropharynx is clear and moist and mucous membranes are normal. No oropharyngeal exudate, posterior oropharyngeal edema or posterior oropharyngeal erythema.  Eyes: Conjunctivae and EOM are normal. Pupils are equal, round, and reactive to light. No scleral icterus.  Neck: Normal range of motion. Neck supple. No thyromegaly present.  Cardiovascular: Normal rate, regular rhythm, normal heart sounds and intact distal pulses.   No murmur heard. Pulses:      Radial pulses are 2+ on the right side, and 2+ on the left  side.  Pulmonary/Chest: Effort normal and breath sounds normal. No respiratory distress. She has no wheezes. She has no rales.  Abdominal: Soft. Bowel sounds are normal. She exhibits no distension and no mass. There is no tenderness. There is no rebound and no  guarding.  Musculoskeletal: Normal range of motion. She exhibits no edema.  Lymphadenopathy:    She has no cervical adenopathy.  Neurological: She is alert and oriented to person, place, and time.  CN grossly intact, station and gait intact  Skin: Skin is warm and dry. No rash noted.  Psychiatric: She has a normal mood and affect. Her behavior is normal. Judgment and thought content normal.  Nursing note and vitals reviewed.  Results for orders placed or performed in visit on 07/19/15  TSH  Result Value Ref Range   TSH 1.50 0.35 - 4.50 uIU/mL  Basic metabolic panel  Result Value Ref Range   Sodium 139 135 - 145 mEq/L   Potassium 4.4 3.5 - 5.1 mEq/L   Chloride 107 96 - 112 mEq/L   CO2 27 19 - 32 mEq/L   Glucose, Bld 84 70 - 99 mg/dL   BUN 11 6 - 23 mg/dL   Creatinine, Ser 0.63 0.40 - 1.20 mg/dL   Calcium 9.0 8.4 - 10.5 mg/dL   GFR 116.91 >60.00 mL/min      Assessment & Plan:   Problem List Items Addressed This Visit    Hypothyroidism    TSH stable. Continue current regimen.      Relevant Medications   levothyroxine (SYNTHROID, LEVOTHROID) 150 MCG tablet   Health care maintenance - Primary    Preventative protocols reviewed and updated unless pt declined. Discussed healthy diet and lifestyle.        Other Visit Diagnoses    Need for influenza vaccination        Relevant Orders    Flu Vaccine QUAD 36+ mos PF IM (Fluarix & Fluzone Quad PF) (Completed)        Follow up plan: Return in about 1 year (around 07/23/2016), or as neede, for annual exam, prior fasting for blood work.

## 2015-07-24 NOTE — Progress Notes (Signed)
Pre visit review using our clinic review tool, if applicable. No additional management support is needed unless otherwise documented below in the visit note. 

## 2015-07-24 NOTE — Assessment & Plan Note (Signed)
Preventative protocols reviewed and updated unless pt declined. Discussed healthy diet and lifestyle.  

## 2015-12-19 HISTORY — PX: LASIK: SHX215

## 2016-04-15 ENCOUNTER — Other Ambulatory Visit: Payer: Self-pay | Admitting: Family Medicine

## 2016-08-25 ENCOUNTER — Other Ambulatory Visit: Payer: Self-pay

## 2016-08-25 MED ORDER — LEVOTHYROXINE SODIUM 150 MCG PO TABS: 150.0000 ug | ORAL_TABLET | Freq: Every day | ORAL | 0 refills | Status: DC

## 2016-08-25 NOTE — Telephone Encounter (Signed)
Pt request # 30 of levothyroxine to [facility]. Pt has CPX scheduled on 09/19/16. Advised pt done and pt will keep CPX appt.

## 2016-08-26 ENCOUNTER — Ambulatory Visit: Payer: BLUE CROSS/BLUE SHIELD

## 2016-09-14 ENCOUNTER — Other Ambulatory Visit: Payer: Self-pay | Admitting: Family Medicine

## 2016-09-14 DIAGNOSIS — E032 Hypothyroidism due to medicaments and other exogenous substances: Secondary | ICD-10-CM

## 2016-09-15 ENCOUNTER — Other Ambulatory Visit (INDEPENDENT_AMBULATORY_CARE_PROVIDER_SITE_OTHER): Payer: 59

## 2016-09-15 DIAGNOSIS — E032 Hypothyroidism due to medicaments and other exogenous substances: Secondary | ICD-10-CM

## 2016-09-15 LAB — BASIC METABOLIC PANEL
BUN: 9 mg/dL (ref 6–23)
CHLORIDE: 106 meq/L (ref 96–112)
CO2: 27 mEq/L (ref 19–32)
CREATININE: 0.67 mg/dL (ref 0.40–1.20)
Calcium: 9 mg/dL (ref 8.4–10.5)
GFR: 108.09 mL/min (ref >60.00)
Glucose, Bld: 87 mg/dL (ref 70–99)
POTASSIUM: 4.1 meq/L (ref 3.5–5.1)
SODIUM: 139 meq/L (ref 135–145)

## 2016-09-15 LAB — T4, FREE: FREE T4: 1.01 ng/dL (ref 0.60–1.60)

## 2016-09-15 LAB — TSH: TSH: 2.6 u[IU]/mL (ref 0.35–4.50)

## 2016-09-19 ENCOUNTER — Ambulatory Visit (INDEPENDENT_AMBULATORY_CARE_PROVIDER_SITE_OTHER): Payer: 59 | Admitting: Family Medicine

## 2016-09-19 ENCOUNTER — Encounter: Payer: Self-pay | Admitting: Family Medicine

## 2016-09-19 VITALS — BP 106/72 | HR 77 | Temp 97.8°F | Ht 65.0 in | Wt 129.5 lb

## 2016-09-19 DIAGNOSIS — Z23 Encounter for immunization: Secondary | ICD-10-CM | POA: Diagnosis not present

## 2016-09-19 DIAGNOSIS — Z0000 Encounter for general adult medical examination without abnormal findings: Secondary | ICD-10-CM | POA: Diagnosis not present

## 2016-09-19 DIAGNOSIS — E032 Hypothyroidism due to medicaments and other exogenous substances: Secondary | ICD-10-CM | POA: Diagnosis not present

## 2016-09-19 MED ORDER — PRENATAL VITAMIN AND MINERAL 28-0.8 MG PO TABS: 1.0000 | ORAL_TABLET | Freq: Every day | ORAL | Status: DC

## 2016-09-19 MED ORDER — LEVOTHYROXINE SODIUM 150 MCG PO TABS: 150.0000 ug | ORAL_TABLET | Freq: Every day | ORAL | 11 refills | Status: DC

## 2016-09-19 NOTE — Progress Notes (Signed)
BP 106/72   Pulse 77   Temp 97.8 F (36.6 C) (Oral)   Ht 5\' 5"  (1.651 m)   Wt 129 lb 8 oz (58.7 kg)   LMP 09/09/2016   SpO2 99%   BMI 21.55 kg/m    CC: CPE Subjective:    Patient ID: Sheena Harris, female    DOB: December 14, 1985, 30 y.o.   MRN: [id number]  HPI: Sheena Harris is a 30 y.o. female presenting on 09/19/2016 for Annual Exam   Hypothyroid - labwork normal. Planning pregnancy - just started prenatal (takes at night separate from levothyroxine) and has appt with OB next month .  Preventative: Well woman with OBGYN at [facility], OCP through their office. Normal pap smears. Sees yearly Flu - today Tdap - today 07/2014 Seat belt use discussed Sunscreen use discussed. No changing moles on skin. Sees derm regularly.   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 Interim medical history since our last visit reviewed. Allergies and medications reviewed and updated. No current outpatient prescriptions on file prior to visit.   No current facility-administered medications on file prior to visit.     Review of Systems  Constitutional: Negative for activity change, appetite change, chills, fatigue, fever and unexpected weight change.  HENT: Negative for hearing loss.   Eyes: Negative for visual disturbance.  Respiratory: Negative for cough, chest tightness, shortness of breath and wheezing.   Cardiovascular: Negative for chest pain, palpitations and leg swelling.  Gastrointestinal: Negative for abdominal distention, abdominal pain, blood in stool, constipation, diarrhea, nausea and vomiting.  Genitourinary: Negative for difficulty urinating and hematuria.  Musculoskeletal: Negative for arthralgias, myalgias and neck pain.  Skin:  Negative for rash.  Neurological: Negative for dizziness, seizures, syncope and headaches.  Hematological: Negative for adenopathy. Does not bruise/bleed easily.  Psychiatric/Behavioral: Negative for dysphoric mood. The patient is not nervous/anxious.    Per HPI unless specifically indicated in ROS section     Objective:    BP 106/72   Pulse 77   Temp 97.8 F (36.6 C) (Oral)   Ht 5\' 5"  (1.651 m)   Wt 129 lb 8 oz (58.7 kg)   LMP 09/09/2016   SpO2 99%   BMI 21.55 kg/m   Wt Readings from Last 3 Encounters:  09/19/16 129 lb 8 oz (58.7 kg)  07/24/15 128 lb 8 oz (58.3 kg)  06/20/15 126 lb (57.2 kg)    Physical Exam  Constitutional: She is oriented to person, place, and time. She appears well-developed and well-nourished. No distress.  HENT:  Head: Normocephalic and atraumatic.  Right Ear: Hearing, tympanic membrane, external ear and ear canal normal.  Left Ear: Hearing, tympanic membrane, external ear and ear canal normal.  Nose: Nose normal.  Mouth/Throat: Uvula is midline, oropharynx is clear and moist and mucous membranes are normal. No oropharyngeal exudate, posterior oropharyngeal edema or posterior oropharyngeal erythema.  Eyes: Conjunctivae and EOM are normal. Pupils are equal, round, and reactive to light. No scleral icterus.  Neck: Normal range of motion. Neck supple. No thyromegaly present.  Cardiovascular: Normal rate, regular rhythm, normal heart sounds and intact distal pulses.   No murmur heard. Pulses:      Radial pulses are 2+ on the right side, and 2+ on the left side.  Pulmonary/Chest: Effort  normal and breath sounds normal. No respiratory distress. She has no wheezes. She has no rales.  Abdominal: Soft. Bowel sounds are normal. She exhibits no distension and no mass. There is no tenderness. There is no rebound and no guarding.  Musculoskeletal: Normal range of motion. She exhibits no edema.  Lymphadenopathy:    She has no cervical adenopathy.  Neurological:  She is alert and oriented to person, place, and time.  CN grossly intact, station and gait intact  Skin: Skin is warm and dry. No rash noted.  Psychiatric: She has a normal mood and affect. Her behavior is normal. Judgment and thought content normal.  Nursing note and vitals reviewed.  Results for orders placed or performed in visit on 09/15/16  TSH  Result Value Ref Range   TSH 2.60 0.35 - 4.50 uIU/mL  Basic metabolic panel  Result Value Ref Range   Sodium 139 135 - 145 mEq/L   Potassium 4.1 3.5 - 5.1 mEq/L   Chloride 106 96 - 112 mEq/L   CO2 27 19 - 32 mEq/L   Glucose, Bld 87 70 - 99 mg/dL   BUN 9 6 - 23 mg/dL   Creatinine, Ser 0.67 0.40 - 1.20 mg/dL   Calcium 9.0 8.4 - 10.5 mg/dL   GFR 108.09 >60.00 mL/min  T4, free  Result Value Ref Range   Free T4 1.01 0.60 - 1.60 ng/dL      Assessment & Plan:   Problem List Items Addressed This Visit    Health care maintenance - Primary    Preventative protocols reviewed and updated unless pt declined. Discussed healthy diet and lifestyle.       Hypothyroidism    Planning pregnancy. Discussed possible need to change dose of levothyroxine - she will check with OB and let me know if any change needed. Will Rx 1 month supply for now.       Relevant Medications   levothyroxine (SYNTHROID, LEVOTHROID) 150 MCG tablet       Follow up plan: Return in about 1 year (around 09/19/2017) for annual exam, prior fasting for blood work.  Ria Bush, MD

## 2016-09-19 NOTE — Assessment & Plan Note (Signed)
Planning pregnancy. Discussed possible need to change dose of levothyroxine - she will check with OB and let me know if any change needed. Will Rx 1 month supply for now.

## 2016-09-19 NOTE — Patient Instructions (Addendum)
Flu shot today You are doing well  Continue current thyroid dose unless OB rec differently Return as needed or in 1 year for next physical  Health Maintenance, Female Introduction Adopting a healthy lifestyle and getting preventive care can go a long way to promote health and wellness. Talk with your health care provider about what schedule of regular examinations is right for you. This is a good chance for you to check in with your provider about disease prevention and staying healthy. In between checkups, there are plenty of things you can do on your own. Experts have done a lot of research about which lifestyle changes and preventive measures are most likely to keep you healthy. Ask your health care provider for more information. Weight and diet Eat a healthy diet  Be sure to include plenty of vegetables, fruits, low-fat dairy products, and lean protein.  Do not eat a lot of foods high in solid fats, added sugars, or salt.  Get regular exercise. This is one of the most important things you can do for your health.  Most adults should exercise for at least 150 minutes each week. The exercise should increase your heart rate and make you sweat (moderate-intensity exercise).  Most adults should also do strengthening exercises at least twice a week. This is in addition to the moderate-intensity exercise. Maintain a healthy weight  Body mass index (BMI) is a measurement that can be used to identify possible weight problems. It estimates body fat based on height and weight. Your health care provider can help determine your BMI and help you achieve or maintain a healthy weight.  For females 6 years of age and older:  A BMI below 18.5 is considered underweight.  A BMI of 18.5 to 24.9 is normal.  A BMI of 25 to 29.9 is considered overweight.  A BMI of 30 and above is considered obese. Watch levels of cholesterol and blood lipids  You should start having your blood tested for lipids and  cholesterol at 32 years of age, then have this test every 5 years.  You may need to have your cholesterol levels checked more often if:  Your lipid or cholesterol levels are high.  You are older than 32 years of age.  You are at high risk for heart disease. Cancer screening Lung Cancer  Lung cancer screening is recommended for adults 32-38 years old who are at high risk for lung cancer because of a history of smoking.  A yearly low-dose CT scan of the lungs is recommended for people who:  Currently smoke.  Have quit within the past 15 years.  Have at least a 30-pack-year history of smoking. A pack year is smoking an average of one pack of cigarettes a day for 1 year.  Yearly screening should continue until it has been 15 years since you quit.  Yearly screening should stop if you develop a health problem that would prevent you from having lung cancer treatment. Breast Cancer  Practice breast self-awareness. This means understanding how your breasts normally appear and feel.  It also means doing regular breast self-exams. Let your health care provider know about any changes, no matter how small.  If you are in your 20s or 30s, you should have a clinical breast exam (CBE) by a health care provider every 1-3 years as part of a regular health exam.  If you are 6 or older, have a CBE every year. Also consider having a breast X-ray (mammogram) every year.  If  you have a family history of breast cancer, talk to your health care provider about genetic screening.  If you are at high risk for breast cancer, talk to your health care provider about having an MRI and a mammogram every year.  Breast cancer gene (BRCA) assessment is recommended for women who have family members with BRCA-related cancers. BRCA-related cancers include:  Breast.  Ovarian.  Tubal.  Peritoneal cancers.  Results of the assessment will determine the need for genetic counseling and BRCA1 and BRCA2  testing. Cervical Cancer  Your health care provider may recommend that you be screened regularly for cancer of the pelvic organs (ovaries, uterus, and vagina). This screening involves a pelvic examination, including checking for microscopic changes to the surface of your cervix (Pap test). You may be encouraged to have this screening done every 3 years, beginning at age 22.  For women ages 56-65, health care providers may recommend pelvic exams and Pap testing every 3 years, or they may recommend the Pap and pelvic exam, combined with testing for human papilloma virus (HPV), every 5 years. Some types of HPV increase your risk of cervical cancer. Testing for HPV may also be done on women of any age with unclear Pap test results.  Other health care providers may not recommend any screening for nonpregnant women who are considered low risk for pelvic cancer and who do not have symptoms. Ask your health care provider if a screening pelvic exam is right for you.  If you have had past treatment for cervical cancer or a condition that could lead to cancer, you need Pap tests and screening for cancer for at least 20 years after your treatment. If Pap tests have been discontinued, your risk factors (such as having a new sexual partner) need to be reassessed to determine if screening should resume. Some women have medical problems that increase the chance of getting cervical cancer. In these cases, your health care provider may recommend more frequent screening and Pap tests. Colorectal Cancer  This type of cancer can be detected and often prevented.  Routine colorectal cancer screening usually begins at 32 years of age and continues through 32 years of age.  Your health care provider may recommend screening at an earlier age if you have risk factors for colon cancer.  Your health care provider may also recommend using home test kits to check for hidden blood in the stool.  A small camera at the end of a  tube can be used to examine your colon directly (sigmoidoscopy or colonoscopy). This is done to check for the earliest forms of colorectal cancer.  Routine screening usually begins at age 78.  Direct examination of the colon should be repeated every 5-10 years through 32 years of age. However, you may need to be screened more often if early forms of precancerous polyps or small growths are found. Skin Cancer  Check your skin from head to toe regularly.  Tell your health care provider about any new moles or changes in moles, especially if there is a change in a mole's shape or color.  Also tell your health care provider if you have a mole that is larger than the size of a pencil eraser.  Always use sunscreen. Apply sunscreen liberally and repeatedly throughout the day.  Protect yourself by wearing long sleeves, pants, a wide-brimmed hat, and sunglasses whenever you are outside. Heart disease, diabetes, and high blood pressure  High blood pressure causes heart disease and increases the risk of  stroke. High blood pressure is more likely to develop in:  People who have blood pressure in the high end of the normal range (130-139/85-89 mm Hg).  People who are overweight or obese.  People who are African American.  If you are 27-38 years of age, have your blood pressure checked every 3-5 years. If you are 79 years of age or older, have your blood pressure checked every year. You should have your blood pressure measured twice-once when you are at a hospital or clinic, and once when you are not at a hospital or clinic. Record the average of the two measurements. To check your blood pressure when you are not at a hospital or clinic, you can use:  An automated blood pressure machine at a pharmacy.  A home blood pressure monitor.  If you are between 31 years and 51 years old, ask your health care provider if you should take aspirin to prevent strokes.  Have regular diabetes screenings. This  involves taking a blood sample to check your fasting blood sugar level.  If you are at a normal weight and have a low risk for diabetes, have this test once every three years after 32 years of age.  If you are overweight and have a high risk for diabetes, consider being tested at a younger age or more often. Preventing infection Hepatitis B  If you have a higher risk for hepatitis B, you should be screened for this virus. You are considered at high risk for hepatitis B if:  You were born in a country where hepatitis B is common. Ask your health care provider which countries are considered high risk.  Your parents were born in a high-risk country, and you have not been immunized against hepatitis B (hepatitis B vaccine).  You have HIV or AIDS.  You use needles to inject street drugs.  You live with someone who has hepatitis B.  You have had sex with someone who has hepatitis B.  You get hemodialysis treatment.  You take certain medicines for conditions, including cancer, organ transplantation, and autoimmune conditions. Hepatitis C  Blood testing is recommended for:  Everyone born from [birth year] through 1967.  Anyone with known risk factors for hepatitis C. Sexually transmitted infections (STIs)  You should be screened for sexually transmitted infections (STIs) including gonorrhea and chlamydia if:  You are sexually active and are younger than 32 years of age.  You are older than 32 years of age and your health care provider tells you that you are at risk for this type of infection.  Your sexual activity has changed since you were last screened and you are at an increased risk for chlamydia or gonorrhea. Ask your health care provider if you are at risk.  If you do not have HIV, but are at risk, it may be recommended that you take a prescription medicine daily to prevent HIV infection. This is called pre-exposure prophylaxis (PrEP). You are considered at risk if:  You are  sexually active and do not regularly use condoms or know the HIV status of your partner(s).  You take drugs by injection.  You are sexually active with a partner who has HIV. Talk with your health care provider about whether you are at high risk of being infected with HIV. If you choose to begin PrEP, you should first be tested for HIV. You should then be tested every 3 months for as long as you are taking PrEP. Pregnancy  If you are premenopausal  and you may become pregnant, ask your health care provider about preconception counseling.  If you may become pregnant, take 400 to 800 micrograms (mcg) of folic acid every day.  If you want to prevent pregnancy, talk to your health care provider about birth control (contraception). Osteoporosis and menopause  Osteoporosis is a disease in which the bones lose minerals and strength with aging. This can result in serious bone fractures. Your risk for osteoporosis can be identified using a bone density scan.  If you are 56 years of age or older, or if you are at risk for osteoporosis and fractures, ask your health care provider if you should be screened.  Ask your health care provider whether you should take a calcium or vitamin D supplement to lower your risk for osteoporosis.  Menopause may have certain physical symptoms and risks.  Hormone replacement therapy may reduce some of these symptoms and risks. Talk to your health care provider about whether hormone replacement therapy is right for you. Follow these instructions at home:  Schedule regular health, dental, and eye exams.  Stay current with your immunizations.  Do not use any tobacco products including cigarettes, chewing tobacco, or electronic cigarettes.  If you are pregnant, do not drink alcohol.  If you are breastfeeding, limit how much and how often you drink alcohol.  Limit alcohol intake to no more than 1 drink per day for nonpregnant women. One drink equals 12 ounces of  beer, 5 ounces of wine, or 1 ounces of hard liquor.  Do not use street drugs.  Do not share needles.  Ask your health care provider for help if you need support or information about quitting drugs.  Tell your health care provider if you often feel depressed.  Tell your health care provider if you have ever been abused or do not feel safe at home. This information is not intended to replace advice given to you by your health care provider. Make sure you discuss any questions you have with your health care provider. Document Released: 04/21/2011 Document Revised: 03/13/2016 Document Reviewed: 07/10/2015  2017 [employer]

## 2016-09-19 NOTE — Assessment & Plan Note (Signed)
Preventative protocols reviewed and updated unless pt declined. Discussed healthy diet and lifestyle.  

## 2016-09-25 ENCOUNTER — Other Ambulatory Visit: Payer: Self-pay | Admitting: Family Medicine

## 2016-09-25 NOTE — Addendum Note (Signed)
Addended by: Lurlean Nanny on: 09/25/2016 02:01 PM   Modules accepted: Orders

## 2016-10-22 ENCOUNTER — Encounter: Payer: Self-pay | Admitting: Family Medicine

## 2016-10-22 DIAGNOSIS — Z6820 Body mass index (BMI) 20.0-20.9, adult: Secondary | ICD-10-CM | POA: Diagnosis not present

## 2016-10-23 MED ORDER — LEVOTHYROXINE SODIUM 175 MCG PO TABS: 175.0000 ug | ORAL_TABLET | Freq: Every day | ORAL | 1 refills | Status: DC

## 2017-04-09 DIAGNOSIS — Z6820 Body mass index (BMI) 20.0-20.9, adult: Secondary | ICD-10-CM | POA: Diagnosis not present

## 2017-04-10 ENCOUNTER — Other Ambulatory Visit: Payer: Self-pay | Admitting: Family Medicine

## 2017-04-28 DIAGNOSIS — E039 Hypothyroidism, unspecified: Secondary | ICD-10-CM | POA: Diagnosis not present

## 2017-04-28 DIAGNOSIS — Z6820 Body mass index (BMI) 20.0-20.9, adult: Secondary | ICD-10-CM | POA: Diagnosis not present

## 2017-06-18 DIAGNOSIS — E039 Hypothyroidism, unspecified: Secondary | ICD-10-CM | POA: Diagnosis not present

## 2017-07-16 DIAGNOSIS — Z23 Encounter for immunization: Secondary | ICD-10-CM | POA: Diagnosis not present

## 2017-10-05 DIAGNOSIS — Z23 Encounter for immunization: Secondary | ICD-10-CM | POA: Diagnosis not present

## 2017-11-05 DIAGNOSIS — E039 Hypothyroidism, unspecified: Secondary | ICD-10-CM | POA: Diagnosis not present

## 2017-12-08 DIAGNOSIS — O480 Post-term pregnancy: Secondary | ICD-10-CM | POA: Diagnosis not present

## 2017-12-11 DIAGNOSIS — O99284 Endocrine, nutritional and metabolic diseases complicating childbirth: Secondary | ICD-10-CM | POA: Diagnosis not present

## 2017-12-11 DIAGNOSIS — O99824 Streptococcus B carrier state complicating childbirth: Secondary | ICD-10-CM | POA: Diagnosis not present

## 2018-01-19 DIAGNOSIS — Z6824 Body mass index (BMI) 24.0-24.9, adult: Secondary | ICD-10-CM | POA: Diagnosis not present

## 2018-01-19 DIAGNOSIS — E039 Hypothyroidism, unspecified: Secondary | ICD-10-CM | POA: Diagnosis not present

## 2018-03-18 ENCOUNTER — Encounter: Payer: Self-pay | Admitting: Family Medicine

## 2018-03-18 ENCOUNTER — Ambulatory Visit (INDEPENDENT_AMBULATORY_CARE_PROVIDER_SITE_OTHER): Payer: 59 | Admitting: Family Medicine

## 2018-03-18 VITALS — BP 114/62 | HR 67 | Temp 98.3°F | Ht 65.0 in | Wt 135.0 lb

## 2018-03-18 DIAGNOSIS — Z0000 Encounter for general adult medical examination without abnormal findings: Secondary | ICD-10-CM

## 2018-03-18 DIAGNOSIS — Z131 Encounter for screening for diabetes mellitus: Secondary | ICD-10-CM | POA: Diagnosis not present

## 2018-03-18 DIAGNOSIS — E039 Hypothyroidism, unspecified: Secondary | ICD-10-CM

## 2018-03-18 LAB — BASIC METABOLIC PANEL
BUN: 10 mg/dL (ref 6–23)
CALCIUM: 9.9 mg/dL (ref 8.4–10.5)
CO2: 23 meq/L (ref 19–32)
CREATININE: 0.68 mg/dL (ref 0.40–1.20)
Chloride: 104 mEq/L (ref 96–112)
GFR: 105.28 mL/min (ref >60.00)
Glucose, Bld: 82 mg/dL (ref 70–99)
Potassium: 4.5 mEq/L (ref 3.5–5.1)
Sodium: 137 mEq/L (ref 135–145)

## 2018-03-18 LAB — T4, FREE: FREE T4: 1.03 ng/dL (ref 0.60–1.60)

## 2018-03-18 LAB — TSH: TSH: 0.37 u[IU]/mL (ref 0.35–4.50)

## 2018-03-18 MED ORDER — LEVOTHYROXINE SODIUM 150 MCG PO TABS: 150.0000 ug | ORAL_TABLET | Freq: Every day | ORAL | 3 refills | Status: DC

## 2018-03-18 NOTE — Assessment & Plan Note (Signed)
Preventative protocols reviewed and updated unless pt declined. Discussed healthy diet and lifestyle.  

## 2018-03-18 NOTE — Patient Instructions (Signed)
You are doing well today Continue healthy diet and exercise.  Return as needed or in 1 year for next physical.  Health Maintenance, Female Adopting a healthy lifestyle and getting preventive care can go a long way to promote health and wellness. Talk with your health care provider about what schedule of regular examinations is right for you. This is a good chance for you to check in with your provider about disease prevention and staying healthy. In between checkups, there are plenty of things you can do on your own. Experts have done a lot of research about which lifestyle changes and preventive measures are most likely to keep you healthy. Ask your health care provider for more information. Weight and diet Eat a healthy diet  Be sure to include plenty of vegetables, fruits, low-fat dairy products, and lean protein.  Do not eat a lot of foods high in solid fats, added sugars, or salt.  Get regular exercise. This is one of the most important things you can do for your health. ? Most adults should exercise for at least 150 minutes each week. The exercise should increase your heart rate and make you sweat (moderate-intensity exercise). ? Most adults should also do strengthening exercises at least twice a week. This is in addition to the moderate-intensity exercise.  Maintain a healthy weight  Body mass index (BMI) is a measurement that can be used to identify possible weight problems. It estimates body fat based on height and weight. Your health care provider can help determine your BMI and help you achieve or maintain a healthy weight.  For females 52 years of age and older: ? A BMI below 18.5 is considered underweight. ? A BMI of 18.5 to 24.9 is normal. ? A BMI of 25 to 29.9 is considered overweight. ? A BMI of 30 and above is considered obese.  Watch levels of cholesterol and blood lipids  You should start having your blood tested for lipids and cholesterol at 34 years of age, then  have this test every 5 years.  You may need to have your cholesterol levels checked more often if: ? Your lipid or cholesterol levels are high. ? You are older than 34 years of age. ? You are at high risk for heart disease.  Cancer screening Lung Cancer  Lung cancer screening is recommended for adults 21-35 years old who are at high risk for lung cancer because of a history of smoking.  A yearly low-dose CT scan of the lungs is recommended for people who: ? Currently smoke. ? Have quit within the past 15 years. ? Have at least a 30-pack-year history of smoking. A pack year is smoking an average of one pack of cigarettes a day for 1 year.  Yearly screening should continue until it has been 15 years since you quit.  Yearly screening should stop if you develop a health problem that would prevent you from having lung cancer treatment.  Breast Cancer  Practice breast self-awareness. This means understanding how your breasts normally appear and feel.  It also means doing regular breast self-exams. Let your health care provider know about any changes, no matter how small.  If you are in your 20s or 30s, you should have a clinical breast exam (CBE) by a health care provider every 1-3 years as part of a regular health exam.  If you are 19 or older, have a CBE every year. Also consider having a breast X-ray (mammogram) every year.  If you have  a family history of breast cancer, talk to your health care provider about genetic screening.  If you are at high risk for breast cancer, talk to your health care provider about having an MRI and a mammogram every year.  Breast cancer gene (BRCA) assessment is recommended for women who have family members with BRCA-related cancers. BRCA-related cancers include: ? Breast. ? Ovarian. ? Tubal. ? Peritoneal cancers.  Results of the assessment will determine the need for genetic counseling and BRCA1 and BRCA2 testing.  Cervical Cancer Your health  care provider may recommend that you be screened regularly for cancer of the pelvic organs (ovaries, uterus, and vagina). This screening involves a pelvic examination, including checking for microscopic changes to the surface of your cervix (Pap test). You may be encouraged to have this screening done every 3 years, beginning at age 5.  For women ages 22-65, health care providers may recommend pelvic exams and Pap testing every 3 years, or they may recommend the Pap and pelvic exam, combined with testing for human papilloma virus (HPV), every 5 years. Some types of HPV increase your risk of cervical cancer. Testing for HPV may also be done on women of any age with unclear Pap test results.  Other health care providers may not recommend any screening for nonpregnant women who are considered low risk for pelvic cancer and who do not have symptoms. Ask your health care provider if a screening pelvic exam is right for you.  If you have had past treatment for cervical cancer or a condition that could lead to cancer, you need Pap tests and screening for cancer for at least 20 years after your treatment. If Pap tests have been discontinued, your risk factors (such as having a new sexual partner) need to be reassessed to determine if screening should resume. Some women have medical problems that increase the chance of getting cervical cancer. In these cases, your health care provider may recommend more frequent screening and Pap tests.  Colorectal Cancer  This type of cancer can be detected and often prevented.  Routine colorectal cancer screening usually begins at 34 years of age and continues through 34 years of age.  Your health care provider may recommend screening at an earlier age if you have risk factors for colon cancer.  Your health care provider may also recommend using home test kits to check for hidden blood in the stool.  A small camera at the end of a tube can be used to examine your colon  directly (sigmoidoscopy or colonoscopy). This is done to check for the earliest forms of colorectal cancer.  Routine screening usually begins at age 16.  Direct examination of the colon should be repeated every 5-10 years through 34 years of age. However, you may need to be screened more often if early forms of precancerous polyps or small growths are found.  Skin Cancer  Check your skin from head to toe regularly.  Tell your health care provider about any new moles or changes in moles, especially if there is a change in a mole's shape or color.  Also tell your health care provider if you have a mole that is larger than the size of a pencil eraser.  Always use sunscreen. Apply sunscreen liberally and repeatedly throughout the day.  Protect yourself by wearing long sleeves, pants, a wide-brimmed hat, and sunglasses whenever you are outside.  Heart disease, diabetes, and high blood pressure  High blood pressure causes heart disease and increases the risk  of stroke. High blood pressure is more likely to develop in: ? People who have blood pressure in the high end of the normal range (130-139/85-89 mm Hg). ? People who are overweight or obese. ? People who are African American.  If you are 16-41 years of age, have your blood pressure checked every 3-5 years. If you are 72 years of age or older, have your blood pressure checked every year. You should have your blood pressure measured twice-once when you are at a hospital or clinic, and once when you are not at a hospital or clinic. Record the average of the two measurements. To check your blood pressure when you are not at a hospital or clinic, you can use: ? An automated blood pressure machine at a pharmacy. ? A home blood pressure monitor.  If you are between 28 years and 84 years old, ask your health care provider if you should take aspirin to prevent strokes.  Have regular diabetes screenings. This involves taking a blood sample to  check your fasting blood sugar level. ? If you are at a normal weight and have a low risk for diabetes, have this test once every three years after 34 years of age. ? If you are overweight and have a high risk for diabetes, consider being tested at a younger age or more often. Preventing infection Hepatitis B  If you have a higher risk for hepatitis B, you should be screened for this virus. You are considered at high risk for hepatitis B if: ? You were born in a country where hepatitis B is common. Ask your health care provider which countries are considered high risk. ? Your parents were born in a high-risk country, and you have not been immunized against hepatitis B (hepatitis B vaccine). ? You have HIV or AIDS. ? You use needles to inject street drugs. ? You live with someone who has hepatitis B. ? You have had sex with someone who has hepatitis B. ? You get hemodialysis treatment. ? You take certain medicines for conditions, including cancer, organ transplantation, and autoimmune conditions.  Hepatitis C  Blood testing is recommended for: ? Everyone born from [birth year] through 1967. ? Anyone with known risk factors for hepatitis C.  Sexually transmitted infections (STIs)  You should be screened for sexually transmitted infections (STIs) including gonorrhea and chlamydia if: ? You are sexually active and are younger than 34 years of age. ? You are older than 34 years of age and your health care provider tells you that you are at risk for this type of infection. ? Your sexual activity has changed since you were last screened and you are at an increased risk for chlamydia or gonorrhea. Ask your health care provider if you are at risk.  If you do not have HIV, but are at risk, it may be recommended that you take a prescription medicine daily to prevent HIV infection. This is called pre-exposure prophylaxis (PrEP). You are considered at risk if: ? You are sexually active and do not regularly  use condoms or know the HIV status of your partner(s). ? You take drugs by injection. ? You are sexually active with a partner who has HIV.  Talk with your health care provider about whether you are at high risk of being infected with HIV. If you choose to begin PrEP, you should first be tested for HIV. You should then be tested every 3 months for as long as you are taking PrEP. Pregnancy  If you are premenopausal and you may become pregnant, ask your health care provider about preconception counseling.  If you may become pregnant, take 400 to 800 micrograms (mcg) of folic acid every day.  If you want to prevent pregnancy, talk to your health care provider about birth control (contraception). Osteoporosis and menopause  Osteoporosis is a disease in which the bones lose minerals and strength with aging. This can result in serious bone fractures. Your risk for osteoporosis can be identified using a bone density scan.  If you are 36 years of age or older, or if you are at risk for osteoporosis and fractures, ask your health care provider if you should be screened.  Ask your health care provider whether you should take a calcium or vitamin D supplement to lower your risk for osteoporosis.  Menopause may have certain physical symptoms and risks.  Hormone replacement therapy may reduce some of these symptoms and risks. Talk to your health care provider about whether hormone replacement therapy is right for you. Follow these instructions at home:  Schedule regular health, dental, and eye exams.  Stay current with your immunizations.  Do not use any tobacco products including cigarettes, chewing tobacco, or electronic cigarettes.  If you are pregnant, do not drink alcohol.  If you are breastfeeding, limit how much and how often you drink alcohol.  Limit alcohol intake to no more than 1 drink per day for nonpregnant women. One drink equals 12 ounces of beer, 5 ounces of wine, or 1 ounces  of hard liquor.  Do not use street drugs.  Do not share needles.  Ask your health care provider for help if you need support or information about quitting drugs.  Tell your health care provider if you often feel depressed.  Tell your health care provider if you have ever been abused or do not feel safe at home. This information is not intended to replace advice given to you by your health care provider. Make sure you discuss any questions you have with your health care provider. Document Released: 04/21/2011 Document Revised: 03/13/2016 Document Reviewed: 07/10/2015 Elsevier Interactive Patient Education  [employer].

## 2018-03-18 NOTE — Progress Notes (Signed)
BP 114/62 (BP Location: Left Arm, Patient Position: Sitting, Cuff Size: Normal)   Pulse 67   Temp 98.3 F (36.8 C) (Oral)   Ht 5\' 5"  (1.651 m)   Wt 135 lb (61.2 kg)   SpO2 98%   BMI 22.47 kg/m    CC: CPE Subjective:    Patient ID: Sheena Harris, female    DOB: 09/26/1986, 32 y.o.   MRN: [id number]  HPI: Sheena Harris is a 32 y.o. female presenting on 03/18/2018 for Annual Exam (Requests to have thyroid checked.)   Just had baby Sheena Harris) 12/12/2017. Breasfeeding.   Hypothyroidism - OBGYN has been monitoring this. Thyroid dose was decreased post pregnancy 01/19/2018.   Preventative: Well woman with OBGYN at [facility], OCP through their office. Normal pap smears. Sees yearly.  Flu - today Tdap 07/2014 Seat belt use discussed Sunscreen use discussed. No changing moles on skin. Sees derm regularly.  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 Interim medical history since our last visit reviewed. Allergies and medications reviewed and updated. Outpatient Medications Prior to Visit  Medication Sig Dispense Refill  . Prenatal Vit-Fe Fumarate-FA (PRENATAL VITAMIN AND MINERAL) 28-0.8 MG TABS Take 1 tablet by mouth daily.    Marland Kitchen levothyroxine (SYNTHROID, LEVOTHROID) 150 MCG tablet Take 150 mcg by mouth daily.  5  . levothyroxine (SYNTHROID, LEVOTHROID) 175 MCG tablet TAKE 1 TABLET (175 MCG TOTAL) BY MOUTH DAILY. 90 tablet 1   No facility-administered medications prior to visit.      Per HPI unless specifically indicated in ROS section below Review of Systems  Constitutional: Negative for activity change, appetite change, chills,  fatigue, fever and unexpected weight change.  HENT: Negative for hearing loss.   Eyes: Negative for visual disturbance.  Respiratory: Negative for cough, chest tightness, shortness of breath and wheezing.   Cardiovascular: Negative for chest pain, palpitations and leg swelling.  Gastrointestinal: Negative for abdominal distention, abdominal pain, blood in stool, constipation, diarrhea, nausea and vomiting.  Genitourinary: Negative for difficulty urinating and hematuria.  Musculoskeletal: Negative for arthralgias, myalgias and neck pain.  Skin: Negative for rash.  Neurological: Negative for dizziness, seizures, syncope and headaches.  Hematological: Negative for adenopathy. Does not bruise/bleed easily.  Psychiatric/Behavioral: Negative for dysphoric mood. The patient is not nervous/anxious.        Objective:    BP 114/62 (BP Location: Left Arm, Patient Position: Sitting, Cuff Size: Normal)   Pulse 67   Temp 98.3 F (36.8 C) (Oral)   Ht 5\' 5"  (1.651 m)   Wt 135 lb (61.2 kg)   SpO2 98%   BMI 22.47 kg/m   Wt Readings from Last 3 Encounters:  03/18/18 135 lb (61.2 kg)  09/19/16 129 lb 8 oz (58.7 kg)  07/24/15 128 lb 8 oz (58.3 kg)    Physical Exam  Constitutional: She is oriented to person, place, and time. She appears well-developed and well-nourished. No distress.  HENT:  Head: Normocephalic and atraumatic.  Right Ear: Hearing, tympanic membrane, external ear and ear canal normal.  Left Ear: Hearing, tympanic membrane, external ear and ear canal normal.  Nose: Nose normal.  Mouth/Throat: Uvula is midline, oropharynx is clear and moist  and mucous membranes are normal. No oropharyngeal exudate, posterior oropharyngeal edema or posterior oropharyngeal erythema.  Eyes: Pupils are equal, round, and reactive to light. Conjunctivae and EOM are normal. No scleral icterus.  Neck: Normal range of motion. Neck supple. No thyromegaly present.  Cardiovascular: Normal rate, regular rhythm,  normal heart sounds and intact distal pulses.  No murmur heard. Pulses:      Radial pulses are 2+ on the right side, and 2+ on the left side.  Pulmonary/Chest: Effort normal and breath sounds normal. No respiratory distress. She has no wheezes. She has no rales.  Abdominal: Soft. Bowel sounds are normal. She exhibits no distension and no mass. There is no tenderness. There is no rebound and no guarding.  Musculoskeletal: Normal range of motion. She exhibits no edema.  Lymphadenopathy:    She has no cervical adenopathy.  Neurological: She is alert and oriented to person, place, and time.  CN grossly intact, station and gait intact  Skin: Skin is warm and dry. No rash noted.  Psychiatric: She has a normal mood and affect. Her behavior is normal. Judgment and thought content normal.  Nursing note and vitals reviewed.  Results for orders placed or performed in visit on 09/15/16  TSH  Result Value Ref Range   TSH 2.60 0.35 - 4.50 uIU/mL  Basic metabolic panel  Result Value Ref Range   Sodium 139 135 - 145 mEq/L   Potassium 4.1 3.5 - 5.1 mEq/L   Chloride 106 96 - 112 mEq/L   CO2 27 19 - 32 mEq/L   Glucose, Bld 87 70 - 99 mg/dL   BUN 9 6 - 23 mg/dL   Creatinine, Ser 0.67 0.40 - 1.20 mg/dL   Calcium 9.0 8.4 - 10.5 mg/dL   GFR 108.09 >60.00 mL/min  T4, free  Result Value Ref Range   Free T4 1.01 0.60 - 1.60 ng/dL      Assessment & Plan:   Problem List Items Addressed This Visit    Health care maintenance - Primary    Preventative protocols reviewed and updated unless pt declined. Discussed healthy diet and lifestyle.       Hypothyroidism    Chronic, stable. Dosage increased by OBGYN post pregnancy. Will update TSH and free T4 to ensure we are on correct dosage. Pt agrees with plan.       Relevant Medications   levothyroxine (SYNTHROID, LEVOTHROID) 150 MCG tablet   Other Relevant Orders   TSH   T4, free    Other Visit Diagnoses    Diabetes mellitus screening        Relevant Orders   Basic metabolic panel       Meds ordered this encounter  Medications  . levothyroxine (SYNTHROID, LEVOTHROID) 150 MCG tablet    Sig: Take 1 tablet (150 mcg total) by mouth daily.    Dispense:  90 tablet    Refill:  3   Orders Placed This Encounter  Procedures  . TSH  . T4, free  . Basic metabolic panel    Follow up plan: Return in about 1 year (around 03/19/2019) for annual exam, prior fasting for blood work.  Ria Bush, MD

## 2018-03-18 NOTE — Assessment & Plan Note (Signed)
Chronic, stable. Dosage increased by OBGYN post pregnancy. Will update TSH and free T4 to ensure we are on correct dosage. Pt agrees with plan.

## 2018-08-17 DIAGNOSIS — Z23 Encounter for immunization: Secondary | ICD-10-CM | POA: Diagnosis not present

## 2018-10-01 DIAGNOSIS — D225 Melanocytic nevi of trunk: Secondary | ICD-10-CM | POA: Diagnosis not present

## 2018-10-01 DIAGNOSIS — D485 Neoplasm of uncertain behavior of skin: Secondary | ICD-10-CM | POA: Diagnosis not present

## 2018-12-14 DIAGNOSIS — D2361 Other benign neoplasm of skin of right upper limb, including shoulder: Secondary | ICD-10-CM | POA: Diagnosis not present

## 2018-12-14 DIAGNOSIS — L301 Dyshidrosis [pompholyx]: Secondary | ICD-10-CM | POA: Diagnosis not present

## 2018-12-14 DIAGNOSIS — D485 Neoplasm of uncertain behavior of skin: Secondary | ICD-10-CM | POA: Diagnosis not present

## 2018-12-14 DIAGNOSIS — Z1283 Encounter for screening for malignant neoplasm of skin: Secondary | ICD-10-CM | POA: Diagnosis not present

## 2019-06-15 ENCOUNTER — Other Ambulatory Visit: Payer: Self-pay | Admitting: Family Medicine

## 2019-06-22 ENCOUNTER — Ambulatory Visit: Payer: 59 | Admitting: Family Medicine

## 2019-06-24 ENCOUNTER — Encounter: Payer: Self-pay | Admitting: Family Medicine

## 2019-06-24 ENCOUNTER — Ambulatory Visit: Payer: 59 | Admitting: Family Medicine

## 2019-06-24 ENCOUNTER — Other Ambulatory Visit: Payer: Self-pay

## 2019-06-24 VITALS — BP 114/70 | HR 72 | Temp 97.6°F | Ht 65.0 in | Wt 124.1 lb

## 2019-06-24 DIAGNOSIS — Z13220 Encounter for screening for lipoid disorders: Secondary | ICD-10-CM | POA: Diagnosis not present

## 2019-06-24 DIAGNOSIS — Z23 Encounter for immunization: Secondary | ICD-10-CM

## 2019-06-24 DIAGNOSIS — E039 Hypothyroidism, unspecified: Secondary | ICD-10-CM | POA: Diagnosis not present

## 2019-06-24 LAB — LIPID PANEL
Cholesterol: 164 mg/dL (ref 0–200)
HDL: 52.7 mg/dL (ref >39.00)
LDL Cholesterol: 98 mg/dL (ref 0–99)
NonHDL: 110.84
Total CHOL/HDL Ratio: 3
Triglycerides: 66 mg/dL (ref 0.0–149.0)
VLDL: 13.2 mg/dL (ref 0.0–40.0)

## 2019-06-24 LAB — TSH: TSH: 1.97 u[IU]/mL (ref 0.35–4.50)

## 2019-06-24 LAB — T4, FREE: Free T4: 1.18 ng/dL (ref 0.60–1.60)

## 2019-06-24 MED ORDER — LEVOTHYROXINE SODIUM 150 MCG PO TABS: 150.0000 ug | ORAL_TABLET | Freq: Every day | ORAL | 3 refills | Status: DC

## 2019-06-24 NOTE — Progress Notes (Signed)
This visit was conducted in person.  BP 114/70 (BP Location: Left Arm, Patient Position: Sitting, Cuff Size: Normal)   Pulse 72   Temp 97.6 F (36.4 C) (Temporal)   Ht 5\' 5"  (1.651 m)   Wt 124 lb 1 oz (56.3 kg)   LMP 05/24/2019   SpO2 99%   BMI 20.65 kg/m    CC: med refill visit Subjective:    Patient ID: Sheena Harris, female    DOB: [birth year], 33 y.o.   MRN: [id number]  HPI: Sheena Harris is a 33 y.o. female presenting on 06/24/2019 for Medication Refill (Needs refill for levothyroxine.)   Hypothyroidism - on levothyroxine 150mg  daily, tolerating well. No hypo or hyperthyroid symptoms at this time.      Relevant past medical, surgical, family and social history reviewed and updated as indicated. Interim medical history since our last visit reviewed. Allergies and medications reviewed and updated. Outpatient Medications Prior to Visit  Medication Sig Dispense Refill  . levothyroxine (SYNTHROID) 150 MCG tablet TAKE 1 TABLET BY MOUTH EVERY DAY 90 tablet 0  . Prenatal Vit-Fe Fumarate-FA (PRENATAL VITAMIN AND MINERAL) 28-0.8 MG TABS Take 1 tablet by mouth daily.    . norethindrone (ORTHO MICRONOR) 0.35 MG tablet Take 1 tablet (0.35 mg total) by mouth daily.     No facility-administered medications prior to visit.      Per HPI unless specifically indicated in ROS section below Review of Systems Objective:    BP 114/70 (BP Location: Left Arm, Patient Position: Sitting, Cuff Size: Normal)   Pulse 72   Temp 97.6 F (36.4 C) (Temporal)   Ht 5\' 5"  (1.651 m)   Wt 124 lb 1 oz (56.3 kg)   LMP 05/24/2019   SpO2 99%   BMI 20.65 kg/m   Wt Readings from Last 3 Encounters:  06/24/19 124 lb 1 oz (56.3 kg)  03/18/18 135 lb (61.2 kg)  09/19/16 129 lb 8 oz (58.7 kg)    Physical Exam Vitals signs and nursing note reviewed.  Constitutional:      Appearance: Normal appearance. She is not ill-appearing.  HENT:     Mouth/Throat:     Mouth: Mucous membranes are moist.   Pharynx: No posterior oropharyngeal erythema.  Neck:     Musculoskeletal: Normal range of motion and neck supple.     Thyroid: No thyroid mass or thyromegaly.  Cardiovascular:     Rate and Rhythm: Normal rate and regular rhythm.     Pulses: Normal pulses.     Heart sounds: Normal heart sounds. No murmur.  Pulmonary:     Effort: Pulmonary effort is normal. No respiratory distress.     Breath sounds: Normal breath sounds. No wheezing, rhonchi or rales.  Neurological:     Mental Status: She is alert.  Psychiatric:        Mood and Affect: Mood normal.        Behavior: Behavior normal.        Thought Content: Thought content normal.        Judgment: Judgment normal.       Results for orders placed or performed in visit on 03/18/18  TSH  Result Value Ref Range   TSH 0.37 0.35 - 4.50 uIU/mL  T4, free  Result Value Ref Range   Free T4 1.03 0.60 - 1.60 ng/dL  Basic metabolic panel  Result Value Ref Range   Sodium 137 135 - 145 mEq/L   Potassium 4.5 3.5 - 5.1 mEq/L   Chloride  104 96 - 112 mEq/L   CO2 23 19 - 32 mEq/L   Glucose, Bld 82 70 - 99 mg/dL   BUN 10 6 - 23 mg/dL   Creatinine, Ser 0.68 0.40 - 1.20 mg/dL   Calcium 9.9 8.4 - 10.5 mg/dL   GFR 105.28 >60.00 mL/min   Assessment & Plan:   Problem List Items Addressed This Visit    Hypothyroidism - Primary    Chronic, stable. Check TSH, free T4. Continue current regimen.       Relevant Medications   levothyroxine (SYNTHROID) 150 MCG tablet   Other Relevant Orders   TSH   T4, free    Other Visit Diagnoses    Lipid screening       Relevant Orders   Lipid panel   Need for influenza vaccination       Relevant Orders   Flu Vaccine QUAD 36+ mos IM (Completed)       Meds ordered this encounter  Medications  . levothyroxine (SYNTHROID) 150 MCG tablet    Sig: Take 1 tablet (150 mcg total) by mouth daily.    Dispense:  90 tablet    Refill:  3   Orders Placed This Encounter  Procedures  . Flu Vaccine QUAD 36+ mos IM   . TSH  . T4, free  . Lipid panel    Patient Instructions  Labs today Thyroid medicine refilled You are doing well  Return as needed or in 1 year for next check up    Follow up plan: Return in about 1 year (around 06/23/2020) for follow up visit.  Ria Bush, MD

## 2019-06-24 NOTE — Patient Instructions (Signed)
Labs today Thyroid medicine refilled You are doing well  Return as needed or in 1 year for next check up

## 2019-06-24 NOTE — Assessment & Plan Note (Signed)
Chronic, stable. Check TSH, free T4. Continue current regimen.

## 2019-08-01 ENCOUNTER — Other Ambulatory Visit: Payer: Self-pay

## 2019-08-01 DIAGNOSIS — Z20822 Contact with and (suspected) exposure to covid-19: Secondary | ICD-10-CM

## 2019-08-02 LAB — NOVEL CORONAVIRUS, NAA: SARS-CoV-2, NAA: NOT DETECTED

## 2019-09-29 ENCOUNTER — Encounter: Payer: Self-pay | Admitting: Family Medicine

## 2019-09-29 ENCOUNTER — Ambulatory Visit: Payer: 59 | Admitting: Family Medicine

## 2019-09-29 ENCOUNTER — Other Ambulatory Visit: Payer: Self-pay

## 2019-09-29 VITALS — BP 114/68 | HR 70 | Temp 97.8°F | Ht 65.0 in | Wt 125.4 lb

## 2019-09-29 DIAGNOSIS — R1012 Left upper quadrant pain: Secondary | ICD-10-CM

## 2019-09-29 DIAGNOSIS — R0789 Other chest pain: Secondary | ICD-10-CM | POA: Diagnosis not present

## 2019-09-29 LAB — POC URINALSYSI DIPSTICK (AUTOMATED)
Bilirubin, UA: NEGATIVE
Blood, UA: NEGATIVE
Glucose, UA: NEGATIVE
Ketones, UA: NEGATIVE
Leukocytes, UA: NEGATIVE
Nitrite, UA: NEGATIVE
Protein, UA: NEGATIVE
Spec Grav, UA: 1.01 (ref 1.010–1.025)
Urobilinogen, UA: 0.2 E.U./dL
pH, UA: 6 (ref 5.0–8.0)

## 2019-09-29 NOTE — Assessment & Plan Note (Addendum)
Anticipate L ribcage strain given point tender to palpation of cartilage of L lower ribcage. Check UA today. Supportive care reviewed. Update if not improving, consider abd imaging especially if persistent discomfort past 3-4 wks.

## 2019-09-29 NOTE — Patient Instructions (Addendum)
I think you have a ribcage strain.  Urine test today  Treat with ibuprofen or tylenol as needed for pain. Continue heating pad.  If persistent and not ipmroving over next 2-3 weeks, let me know.

## 2019-09-29 NOTE — Progress Notes (Signed)
This visit was conducted in person.  BP 114/68 (BP Location: Left Arm, Patient Position: Sitting, Cuff Size: Normal)   Pulse 70   Temp 97.8 F (36.6 C) (Temporal)   Ht 5\' 5"  (1.651 m)   Wt 125 lb 6 oz (56.9 kg)   LMP 09/20/2019   SpO2 99%   BMI 20.86 kg/m    CC: LUQ abd discomfort  Subjective:    Patient ID: Sheena Harris, female    DOB: 16-Nov-1985, 33 y.o.   MRN: [id number]  HPI: Sheena Harris is a 33 y.o. female presenting on 09/29/2019 for Abdominal Pain (C/o LUQ abd pain.  Feels like bruised ribs.  Started about 2 wks ago. )   2 wk h/o discomfort at L lower ribcage with radiation down to LUQ. No aggravating factors, no alleviating factors. Pain not food related. Feels better at night when she doesn't have sports bra/bra on.   Denies fevers/chills, nausea, vomiting, diarrhea/constipation, indigestion.  No GERD.   She does have toddler so possible injury to chest wall but doesn't remember specific injury/trauma. Hasn't tried anything for this other than intermittent heat pad use.      Relevant past medical, surgical, family and social history reviewed and updated as indicated. Interim medical history since our last visit reviewed. Allergies and medications reviewed and updated. Outpatient Medications Prior to Visit  Medication Sig Dispense Refill  . levothyroxine (SYNTHROID) 150 MCG tablet Take 1 tablet (150 mcg total) by mouth daily. 90 tablet 3  . TRI-PREVIFEM 0.18/0.215/0.25 MG-35 MCG tablet Take 1 tablet by mouth daily.    . norethindrone (ORTHO MICRONOR) 0.35 MG tablet Take 1 tablet (0.35 mg total) by mouth daily.     No facility-administered medications prior to visit.     Per HPI unless specifically indicated in ROS section below Review of Systems Objective:    BP 114/68 (BP Location: Left Arm, Patient Position: Sitting, Cuff Size: Normal)   Pulse 70   Temp 97.8 F (36.6 C) (Temporal)   Ht 5\' 5"  (1.651 m)   Wt 125 lb 6 oz (56.9 kg)   LMP 09/20/2019    SpO2 99%   BMI 20.86 kg/m   Wt Readings from Last 3 Encounters:  09/29/19 125 lb 6 oz (56.9 kg)  06/24/19 124 lb 1 oz (56.3 kg)  03/18/18 135 lb (61.2 kg)    Physical Exam Vitals and nursing note reviewed.  Constitutional:      Appearance: Normal appearance. She is well-developed. She is not ill-appearing.  Cardiovascular:     Rate and Rhythm: Normal rate and regular rhythm.     Pulses: Normal pulses.     Heart sounds: Normal heart sounds. No murmur.  Pulmonary:     Effort: Pulmonary effort is normal. No respiratory distress.     Breath sounds: Normal breath sounds. No wheezing, rhonchi or rales.  Chest:     Chest wall: Tenderness present.       Comments: Point tenderness to palpation to cartilage at L ribcage without rash or bruising Abdominal:     General: Abdomen is flat. Bowel sounds are normal. There is no distension.     Palpations: Abdomen is soft. There is no mass.     Tenderness: There is abdominal tenderness (mild) in the left upper quadrant. There is no right CVA tenderness, left CVA tenderness, guarding or rebound. Negative signs include Murphy's sign.     Hernia: No hernia is present.  Neurological:     Mental Status: She is alert.  Psychiatric:        Mood and Affect: Mood normal.        Behavior: Behavior normal.       Results for orders placed or performed in visit on 09/29/19  POCT Urinalysis Dipstick (Automated)  Result Value Ref Range   Color, UA yellow    Clarity, UA clear    Glucose, UA Negative Negative   Bilirubin, UA negative    Ketones, UA negative    Spec Grav, UA 1.010 1.010 - 1.025   Blood, UA negative    pH, UA 6.0 5.0 - 8.0   Protein, UA Negative Negative   Urobilinogen, UA 0.2 0.2 or 1.0 E.U./dL   Nitrite, UA negative    Leukocytes, UA Negative Negative   Assessment & Plan:  This visit occurred during the SARS-CoV-2 public health emergency.  Safety protocols were in place, including screening questions prior to the visit, additional  usage of staff PPE, and extensive cleaning of exam room while observing appropriate contact time as indicated for disinfecting solutions.   Problem List Items Addressed This Visit    Left-sided chest wall pain - Primary    Anticipate L ribcage strain given point tender to palpation of cartilage of L lower ribcage. Check UA today. Supportive care reviewed. Update if not improving, consider abd imaging especially if persistent discomfort past 3-4 wks.        Other Visit Diagnoses    Left upper quadrant abdominal pain       Relevant Orders   POCT Urinalysis Dipstick (Automated) (Completed)       No orders of the defined types were placed in this encounter.  Orders Placed This Encounter  Procedures  . POCT Urinalysis Dipstick (Automated)   Patient Instructions  I think you have a ribcage strain.  Urine test today  Treat with ibuprofen or tylenol as needed for pain. Continue heating pad.  If persistent and not ipmroving over next 2-3 weeks, let me know.    Follow up plan: Return if symptoms worsen or fail to improve.  Ria Bush, MD

## 2019-10-24 ENCOUNTER — Other Ambulatory Visit: Payer: Self-pay | Admitting: Obstetrics and Gynecology

## 2019-10-24 DIAGNOSIS — Z1231 Encounter for screening mammogram for malignant neoplasm of breast: Secondary | ICD-10-CM

## 2019-11-14 ENCOUNTER — Ambulatory Visit
Admission: RE | Admit: 2019-11-14 | Discharge: 2019-11-14 | Disposition: A | Discharge status: Home / Self Care | Payer: 59 | Source: Ambulatory Visit | Attending: Obstetrics and Gynecology | Admitting: Obstetrics and Gynecology

## 2019-11-14 DIAGNOSIS — Z1231 Encounter for screening mammogram for malignant neoplasm of breast: Secondary | ICD-10-CM | POA: Insufficient documentation

## 2020-01-13 ENCOUNTER — Ambulatory Visit: Payer: 59 | Attending: Internal Medicine

## 2020-01-13 DIAGNOSIS — Z23 Encounter for immunization: Secondary | ICD-10-CM

## 2020-01-13 NOTE — Progress Notes (Signed)
   Covid-19 Vaccination Clinic  Name:  Sheena Harris    MRN: [id number] DOB: 1986-05-20  01/13/2020  [name] was observed post Covid-19 immunization for 15 minutes without incident. She was provided with Vaccine Information Sheet and instruction to access the V-Safe system.   [name] was instructed to call 911 with any severe reactions post vaccine: Marland Kitchen Difficulty breathing  . Swelling of face and throat  . A fast heartbeat  . A bad rash all over body  . Dizziness and weakness   Immunizations Administered    Name Date Dose VIS Date Route   Moderna COVID-19 Vaccine 01/13/2020 11:28 AM 0.5 mL 09/20/2019 Intramuscular   Manufacturer: Moderna   Lot: [id number]   [id number]

## 2020-02-14 ENCOUNTER — Ambulatory Visit: Payer: 59 | Attending: Internal Medicine

## 2020-02-14 DIAGNOSIS — Z23 Encounter for immunization: Secondary | ICD-10-CM

## 2020-02-14 NOTE — Progress Notes (Signed)
   Covid-19 Vaccination Clinic  Name:  Sheena Harris    MRN: [id number] DOB: 1986/07/11  02/14/2020  [name] was observed post Covid-19 immunization for 15 minutes without incident. She was provided with Vaccine Information Sheet and instruction to access the V-Safe system.   [name] was instructed to call 911 with any severe reactions post vaccine: Marland Kitchen Difficulty breathing  . Swelling of face and throat  . A fast heartbeat  . A bad rash all over body  . Dizziness and weakness   Immunizations Administered    Name Date Dose VIS Date Route   Moderna COVID-19 Vaccine 02/14/2020 12:10 PM 0.5 mL 09/2019 Intramuscular   Manufacturer: Moderna   Lot: [id number]   [id number]

## 2020-03-22 ENCOUNTER — Encounter: Payer: Self-pay | Admitting: Family Medicine

## 2020-03-30 ENCOUNTER — Other Ambulatory Visit: Payer: Self-pay

## 2020-03-30 MED ORDER — LEVOTHYROXINE SODIUM 150 MCG PO TABS: 150.0000 ug | ORAL_TABLET | Freq: Every day | ORAL | 1 refills | Status: DC

## 2020-04-08 ENCOUNTER — Encounter: Payer: Self-pay | Admitting: Family Medicine

## 2020-04-08 NOTE — Progress Notes (Signed)
This visit was conducted in person.  BP 112/76 (BP Location: Left Arm, Patient Position: Sitting, Cuff Size: Normal)   Pulse 72   Temp 97.6 F (36.4 C) (Temporal)   Ht 5\' 5"  (1.651 m)   Wt 124 lb (56.2 kg)   LMP 03/28/2020   SpO2 100%   BMI 20.63 kg/m    CC: check lump on ribs Subjective:    Patient ID: Sheena Harris, female    DOB: 1986/02/16, 34 y.o.   MRN: [id number]  HPI: Sheena Harris is a 34 y.o. female presenting on 04/09/2020 for Cyst (C/o lump on ribs inferior to left breast.  Noticed about 2 wks. )   See prior note for details.   Noted lump to left lower ribcage associated with intermittent left chest wall discomfort for several months without inciting trauma/injury. Non tender lump noted a few weeks ago. No other lumps or bumps or tender spots.   Finds tender spot right at area where she leans on when picking up child from child - 11 yo.   Mammogram 10/2019 WNL Birads1     Relevant past medical, surgical, family and social history reviewed and updated as indicated. Interim medical history since our last visit reviewed. Allergies and medications reviewed and updated. Outpatient Medications Prior to Visit  Medication Sig Dispense Refill  . levothyroxine (SYNTHROID) 150 MCG tablet Take 1 tablet (150 mcg total) by mouth daily. 90 tablet 1  . TRI-PREVIFEM 0.18/0.215/0.25 MG-35 MCG tablet Take 1 tablet by mouth daily.     No facility-administered medications prior to visit.     Per HPI unless specifically indicated in ROS section below Review of Systems Objective:  BP 112/76 (BP Location: Left Arm, Patient Position: Sitting, Cuff Size: Normal)   Pulse 72   Temp 97.6 F (36.4 C) (Temporal)   Ht 5\' 5"  (1.651 m)   Wt 124 lb (56.2 kg)   LMP 03/28/2020   SpO2 100%   BMI 20.63 kg/m   Wt Readings from Last 3 Encounters:  04/09/20 124 lb (56.2 kg)  09/29/19 125 lb 6 oz (56.9 kg)  06/24/19 124 lb 1 oz (56.3 kg)      Physical Exam Vitals and nursing note  reviewed.  Constitutional:      Appearance: Normal appearance. She is not ill-appearing.  Chest:     Chest wall: Mass and tenderness present.       Comments:  Freely mobile small lump below left breast midline anterior chest wall  Area below lump at mid anterior ribcage that is tender to palpation  Lymphadenopathy:     Head:     Right side of head: No submental, submandibular, tonsillar, preauricular or posterior auricular adenopathy.     Left side of head: No submental, submandibular, tonsillar, preauricular or posterior auricular adenopathy.     Cervical: No cervical adenopathy.     Right cervical: No superficial cervical adenopathy.    Left cervical: No superficial cervical adenopathy.     Upper Body:     Right upper body: No supraclavicular, axillary or pectoral adenopathy.     Left upper body: No supraclavicular or axillary adenopathy.  Neurological:     Mental Status: She is alert.       Assessment & Plan:  This visit occurred during the SARS-CoV-2 public health emergency.  Safety protocols were in place, including screening questions prior to the visit, additional usage of staff PPE, and extensive cleaning of exam room while observing appropriate contact time as indicated for  disinfecting solutions.   Problem List Items Addressed This Visit    Mass of left chest wall - Primary    Anticipate cyst - not truly associated with breast. Check soft tissue chest wall ultrasound for definitive evaluation.       Relevant Orders   [place] CHEST SOFT TISSUE   Left-sided chest wall pain    Anticipate bony bruise to left lower ribcage at area where she bends over to pick up daughter from crib. Supportive care reviewed.           No orders of the defined types were placed in this encounter.  Orders Placed This Encounter  Procedures  . [place] CHEST SOFT TISSUE    Standing Status:   Future    Standing Expiration Date:   04/09/2021    Order Specific Question:   Reason for Exam (SYMPTOM   OR DIAGNOSIS REQUIRED)    Answer:   mobile lump below left breast on anterior chest wall    Order Specific Question:   Preferred imaging location?    Answer:   [facility]    Patient Instructions  Let's check chest wall ultrasound for further evaluation of this lump. I think it may be a cyst - if that's the case, we can just watch. Let me know if enlarging or becoming painful. You could try warm compress/heating pad to area to see if any improvement.    Follow up plan: Return if symptoms worsen or fail to improve.  Ria Bush, MD

## 2020-04-09 ENCOUNTER — Inpatient Hospital Stay: Admission: RE | Admit: 2020-04-09 | Payer: 59 | Source: Ambulatory Visit

## 2020-04-09 ENCOUNTER — Encounter: Payer: Self-pay | Admitting: Family Medicine

## 2020-04-09 ENCOUNTER — Ambulatory Visit: Payer: 59 | Admitting: Family Medicine

## 2020-04-09 ENCOUNTER — Other Ambulatory Visit: Payer: Self-pay

## 2020-04-09 ENCOUNTER — Ambulatory Visit
Admission: RE | Admit: 2020-04-09 | Discharge: 2020-04-09 | Disposition: A | Discharge status: Home / Self Care | Payer: 59 | Source: Ambulatory Visit | Attending: Family Medicine | Admitting: Family Medicine

## 2020-04-09 VITALS — BP 112/76 | HR 72 | Temp 97.6°F | Ht 65.0 in | Wt 124.0 lb

## 2020-04-09 DIAGNOSIS — R0789 Other chest pain: Secondary | ICD-10-CM

## 2020-04-09 DIAGNOSIS — D171 Benign lipomatous neoplasm of skin and subcutaneous tissue of trunk: Secondary | ICD-10-CM | POA: Insufficient documentation

## 2020-04-09 DIAGNOSIS — R222 Localized swelling, mass and lump, trunk: Secondary | ICD-10-CM

## 2020-04-09 NOTE — Patient Instructions (Signed)
Let's check chest wall ultrasound for further evaluation of this lump. I think it may be a cyst - if that's the case, we can just watch. Let me know if enlarging or becoming painful. You could try warm compress/heating pad to area to see if any improvement.

## 2020-04-09 NOTE — Assessment & Plan Note (Signed)
Anticipate cyst - not truly associated with breast. Check soft tissue chest wall ultrasound for definitive evaluation.

## 2020-04-09 NOTE — Assessment & Plan Note (Signed)
Anticipate bony bruise to left lower ribcage at area where she bends over to pick up daughter from crib. Supportive care reviewed.

## 2020-04-13 ENCOUNTER — Ambulatory Visit: Admission: RE | Admit: 2020-04-13 | Payer: 59 | Source: Ambulatory Visit

## 2020-06-25 ENCOUNTER — Other Ambulatory Visit: Payer: Self-pay | Admitting: Family Medicine

## 2020-06-27 NOTE — Telephone Encounter (Signed)
E-scribed refill.  Plz schedule cpe and lab visits.  

## 2020-07-19 NOTE — Telephone Encounter (Signed)
Noted  

## 2020-07-19 NOTE — Telephone Encounter (Signed)
Patient has scheduled.

## 2020-09-09 ENCOUNTER — Other Ambulatory Visit: Payer: Self-pay | Admitting: Family Medicine

## 2020-09-09 DIAGNOSIS — Z131 Encounter for screening for diabetes mellitus: Secondary | ICD-10-CM

## 2020-09-09 DIAGNOSIS — E039 Hypothyroidism, unspecified: Secondary | ICD-10-CM

## 2020-09-10 ENCOUNTER — Other Ambulatory Visit (INDEPENDENT_AMBULATORY_CARE_PROVIDER_SITE_OTHER): Payer: 59

## 2020-09-10 ENCOUNTER — Other Ambulatory Visit: Payer: Self-pay

## 2020-09-10 DIAGNOSIS — Z131 Encounter for screening for diabetes mellitus: Secondary | ICD-10-CM

## 2020-09-10 DIAGNOSIS — E039 Hypothyroidism, unspecified: Secondary | ICD-10-CM

## 2020-09-10 LAB — CBC WITH DIFFERENTIAL/PLATELET
Basophils Absolute: 0 10*3/uL (ref 0.0–0.1)
Basophils Relative: 0.5 % (ref 0.0–3.0)
Eosinophils Absolute: 0.2 10*3/uL (ref 0.0–0.7)
Eosinophils Relative: 3.7 % (ref 0.0–5.0)
HCT: 40.2 % (ref 36.0–46.0)
Hemoglobin: 13.9 g/dL (ref 12.0–15.0)
Lymphocytes Relative: 28.1 % (ref 12.0–46.0)
Lymphs Abs: 1.5 10*3/uL (ref 0.7–4.0)
MCHC: 34.5 g/dL (ref 30.0–36.0)
MCV: 86.7 fl (ref 78.0–100.0)
Monocytes Absolute: 0.5 10*3/uL (ref 0.1–1.0)
Monocytes Relative: 8.5 % (ref 3.0–12.0)
Neutro Abs: 3.2 10*3/uL (ref 1.4–7.7)
Neutrophils Relative %: 59.2 % (ref 43.0–77.0)
Platelets: 294 10*3/uL (ref 150.0–400.0)
RBC: 4.63 Mil/uL (ref 3.87–5.11)
RDW: 12 % (ref 11.5–15.5)
WBC: 5.4 10*3/uL (ref 4.0–10.5)

## 2020-09-10 LAB — BASIC METABOLIC PANEL
BUN: 11 mg/dL (ref 6–23)
CO2: 27 mEq/L (ref 19–32)
Calcium: 9 mg/dL (ref 8.4–10.5)
Chloride: 107 mEq/L (ref 96–112)
Creatinine, Ser: 0.61 mg/dL (ref 0.40–1.20)
GFR: 114.98 mL/min (ref >60.00)
Glucose, Bld: 82 mg/dL (ref 70–99)
Potassium: 4.4 mEq/L (ref 3.5–5.1)
Sodium: 140 mEq/L (ref 135–145)

## 2020-09-10 LAB — TSH: TSH: 1.56 u[IU]/mL (ref 0.35–4.50)

## 2020-09-18 ENCOUNTER — Encounter: Payer: Self-pay | Admitting: Family Medicine

## 2020-09-18 ENCOUNTER — Ambulatory Visit (INDEPENDENT_AMBULATORY_CARE_PROVIDER_SITE_OTHER): Payer: 59 | Admitting: Family Medicine

## 2020-09-18 ENCOUNTER — Other Ambulatory Visit: Payer: Self-pay

## 2020-09-18 VITALS — BP 118/66 | HR 95 | Temp 98.1°F | Ht 65.0 in | Wt 127.5 lb

## 2020-09-18 DIAGNOSIS — Z923 Personal history of irradiation: Secondary | ICD-10-CM

## 2020-09-18 DIAGNOSIS — Z0000 Encounter for general adult medical examination without abnormal findings: Secondary | ICD-10-CM | POA: Diagnosis not present

## 2020-09-18 DIAGNOSIS — E039 Hypothyroidism, unspecified: Secondary | ICD-10-CM | POA: Diagnosis not present

## 2020-09-18 DIAGNOSIS — Z23 Encounter for immunization: Secondary | ICD-10-CM | POA: Diagnosis not present

## 2020-09-18 DIAGNOSIS — R222 Localized swelling, mass and lump, trunk: Secondary | ICD-10-CM | POA: Diagnosis not present

## 2020-09-18 NOTE — Patient Instructions (Signed)
Flu shot today  For persistent discomfort lower chest wall - we will refer you to surgery for evaluation. In the interim, try voltaren (diclofenac) topical OTC anti inflammatory gel twice daily for 1-2 weeks.  You are doing well today Return as needed or in 1 year for next physical.   Health Maintenance, Female Adopting a healthy lifestyle and getting preventive care are important in promoting health and wellness. Ask your health care provider about:  The right schedule for you to have regular tests and exams.  Things you can do on your own to prevent diseases and keep yourself healthy. What should I know about diet, weight, and exercise? Eat a healthy diet   Eat a diet that includes plenty of vegetables, fruits, low-fat dairy products, and lean protein.  Do not eat a lot of foods that are high in solid fats, added sugars, or sodium. Maintain a healthy weight Body mass index (BMI) is used to identify weight problems. It estimates body fat based on height and weight. Your health care provider can help determine your BMI and help you achieve or maintain a healthy weight. Get regular exercise Get regular exercise. This is one of the most important things you can do for your health. Most adults should:  Exercise for at least 150 minutes each week. The exercise should increase your heart rate and make you sweat (moderate-intensity exercise).  Do strengthening exercises at least twice a week. This is in addition to the moderate-intensity exercise.  Spend less time sitting. Even light physical activity can be beneficial. Watch cholesterol and blood lipids Have your blood tested for lipids and cholesterol at 36 years of age, then have this test every 5 years. Have your cholesterol levels checked more often if:  Your lipid or cholesterol levels are high.  You are older than 36 years of age.  You are at high risk for heart disease. What should I know about cancer screening? Depending on  your health history and family history, you may need to have cancer screening at various ages. This may include screening for:  Breast cancer.  Cervical cancer.  Colorectal cancer.  Skin cancer.  Lung cancer. What should I know about heart disease, diabetes, and high blood pressure? Blood pressure and heart disease  High blood pressure causes heart disease and increases the risk of stroke. This is more likely to develop in people who have high blood pressure readings, are of African descent, or are overweight.  Have your blood pressure checked: ? Every 3-5 years if you are 36-36 years of age. ? Every year if you are 36 years old or older. Diabetes Have regular diabetes screenings. This checks your fasting blood sugar level. Have the screening done:  Once every three years after age 36 if you are at a normal weight and have a low risk for diabetes.  More often and at a younger age if you are overweight or have a high risk for diabetes. What should I know about preventing infection? Hepatitis B If you have a higher risk for hepatitis B, you should be screened for this virus. Talk with your health care provider to find out if you are at risk for hepatitis B infection. Hepatitis C Testing is recommended for:  Everyone born from [birth year] through 1965.  Anyone with known risk factors for hepatitis C. Sexually transmitted infections (STIs)  Get screened for STIs, including gonorrhea and chlamydia, if: ? You are sexually active and are younger than 36 years of age. ?  You are older than 36 years of age and your health care provider tells you that you are at risk for this type of infection. ? Your sexual activity has changed since you were last screened, and you are at increased risk for chlamydia or gonorrhea. Ask your health care provider if you are at risk.  Ask your health care provider about whether you are at high risk for HIV. Your health care provider may recommend a prescription  medicine to help prevent HIV infection. If you choose to take medicine to prevent HIV, you should first get tested for HIV. You should then be tested every 3 months for as long as you are taking the medicine. Pregnancy  If you are about to stop having your period (premenopausal) and you may become pregnant, seek counseling before you get pregnant.  Take 400 to 800 micrograms (mcg) of folic acid every day if you become pregnant.  Ask for birth control (contraception) if you want to prevent pregnancy. Osteoporosis and menopause Osteoporosis is a disease in which the bones lose minerals and strength with aging. This can result in bone fractures. If you are 36 years old or older, or if you are at risk for osteoporosis and fractures, ask your health care provider if you should:  Be screened for bone loss.  Take a calcium or vitamin D supplement to lower your risk of fractures.  Be given hormone replacement therapy (HRT) to treat symptoms of menopause. Follow these instructions at home: Lifestyle  Do not use any products that contain nicotine or tobacco, such as cigarettes, e-cigarettes, and chewing tobacco. If you need help quitting, ask your health care provider.  Do not use street drugs.  Do not share needles.  Ask your health care provider for help if you need support or information about quitting drugs. Alcohol use  Do not drink alcohol if: ? Your health care provider tells you not to drink. ? You are pregnant, may be pregnant, or are planning to become pregnant.  If you drink alcohol: ? Limit how much you use to 0-1 drink a day. ? Limit intake if you are breastfeeding.  Be aware of how much alcohol is in your drink. In the [place] one drink equals one 12 oz bottle of beer (355 mL), one 5 oz glass of wine (148 mL), or one 1 oz glass of hard liquor (44 mL). General instructions  Schedule regular health, dental, and eye exams.  Stay current with your vaccines.  Tell your health  care provider if: ? You often feel depressed. ? You have ever been abused or do not feel safe at home. Summary  Adopting a healthy lifestyle and getting preventive care are important in promoting health and wellness.  Follow your health care provider's instructions about healthy diet, exercising, and getting tested or screened for diseases.  Follow your health care provider's instructions on monitoring your cholesterol and blood pressure. This information is not intended to replace advice given to you by your health care provider. Make sure you discuss any questions you have with your health care provider. Document Revised: 09/29/2018 Document Reviewed: 09/29/2018 Elsevier Patient Education  2020 [employer].

## 2020-09-18 NOTE — Assessment & Plan Note (Addendum)
Mobile cystic mass to left chest wall inferior to breast. Not consistent with LN or lipoma, not consistent with prominent cartilage (mass is in skin).  Chest wall soft tissue US returned normal 03/2020.  Screening mammo WNL 10/2019.  No change in mass but still palpable.  Discussed referral to gen surg for persistent discomfort, but will start with dx mammo/L breast US.  May try voltaren topically in interim.

## 2020-09-18 NOTE — Progress Notes (Addendum)
Patient ID: Sheena Harris, female    DOB: [birth year], 34 y.o.   MRN: [id number]  This visit was conducted in person.  BP 118/66 (BP Location: Left Arm, Patient Position: Sitting, Cuff Size: Normal)   Pulse 95   Temp 98.1 F (36.7 C) (Temporal)   Ht 5\' 5"  (1.651 m)   Wt 127 lb 8 oz (57.8 kg)   LMP 09/07/2020   SpO2 98%   BMI 21.22 kg/m    CC: CPE Subjective:   HPI: Sheena Harris is a 34 y.o. female presenting on 09/18/2020 for Annual Exam   Mass of L chest wall thought cyst - soft tissue US 03/2020 returned normal. ?prominent costal cartilage by [place] - she still feels bothersome discomfort ie when wearing wired bra, also feels occasional painful charlie horse cramping in that area - happens mostly when playing with daughter. Lump first noted ~03/2020.   No new joint pains, new skin rash, red eyes.   Preventative: Well woman with OBGYN at [facility], OCP through their office. Normal pap smears.Sees yearly.  Mammogram 10/2019 WNL Birads1 Flu - today Tdap 07/2014, 09/2017 COVID vaccine Moderna 12/2019, 01/2020 Seat belt use discussed Sunscreen use discussed. No changing moles on skin. Sees derm regularly.  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 Interim medical history since our last visit reviewed. Allergies and medications reviewed and updated. Outpatient Medications Prior to Visit  Medication Sig Dispense Refill  . levothyroxine (SYNTHROID) 150 MCG tablet TAKE 1 TABLET BY MOUTH EVERY DAY 90 tablet 0   No facility-administered medications prior to visit.     Per HPI unless specifically indicated in ROS section below Review  of Systems  Constitutional: Negative for activity change, appetite change, chills, fatigue, fever and unexpected weight change.  HENT: Negative for hearing loss.   Eyes: Negative for visual disturbance.  Respiratory: Negative for cough, chest tightness, shortness of breath and wheezing.   Cardiovascular: Negative for chest pain, palpitations and leg swelling.  Gastrointestinal: Negative for abdominal distention, abdominal pain, blood in stool, constipation, diarrhea, nausea and vomiting.  Genitourinary: Negative for difficulty urinating and hematuria.  Musculoskeletal: Negative for arthralgias, myalgias and neck pain.  Skin: Negative for rash.  Neurological: Negative for dizziness, seizures, syncope and headaches.  Hematological: Negative for adenopathy. Does not bruise/bleed easily.  Psychiatric/Behavioral: Negative for dysphoric mood. The patient is not nervous/anxious.    Objective:  BP 118/66 (BP Location: Left Arm, Patient Position: Sitting, Cuff Size: Normal)   Pulse 95   Temp 98.1 F (36.7 C) (Temporal)   Ht 5\' 5"  (1.651 m)   Wt 127 lb 8 oz (57.8 kg)   LMP 09/07/2020   SpO2 98%   BMI 21.22 kg/m   Wt Readings from Last 3 Encounters:  09/18/20 127 lb 8 oz (57.8 kg)  04/09/20 124 lb (56.2 kg)  09/29/19 125 lb 6 oz (56.9 kg)      Physical Exam Vitals and nursing note reviewed.  Constitutional:      General: She is not in acute distress.    Appearance: Normal appearance. She is well-developed. She is not ill-appearing.  HENT:     Head: Normocephalic  and atraumatic.     Right Ear: Hearing, tympanic membrane, ear canal and external ear normal.     Left Ear: Hearing, tympanic membrane, ear canal and external ear normal.  Eyes:     General: No scleral icterus.    Extraocular Movements: Extraocular movements intact.     Conjunctiva/sclera: Conjunctivae normal.     Pupils: Pupils are equal, round, and reactive to light.  Neck:     Thyroid: No thyroid mass or thyromegaly.    Cardiovascular:     Rate and Rhythm: Normal rate and regular rhythm.     Pulses: Normal pulses.          Radial pulses are 2+ on the right side and 2+ on the left side.     Heart sounds: Normal heart sounds. No murmur heard.   Pulmonary:     Effort: Pulmonary effort is normal. No respiratory distress.     Breath sounds: Normal breath sounds. No wheezing, rhonchi or rales.  Chest:     Chest wall: Mass present. No swelling or tenderness.       Comments: Mobile nontender ~1cm lump left inferior breast  Abdominal:     General: Abdomen is flat. Bowel sounds are normal. There is no distension.     Palpations: Abdomen is soft. There is no mass.     Tenderness: There is no abdominal tenderness. There is no guarding or rebound.     Hernia: No hernia is present.  Musculoskeletal:        General: Normal range of motion.     Cervical back: Normal range of motion and neck supple.     Right lower leg: No edema.     Left lower leg: No edema.  Lymphadenopathy:     Cervical: No cervical adenopathy.  Skin:    General: Skin is warm and dry.     Findings: No rash.  Neurological:     General: No focal deficit present.     Mental Status: She is alert and oriented to person, place, and time.     Comments: CN grossly intact, station and gait intact  Psychiatric:        Mood and Affect: Mood normal.        Behavior: Behavior normal.        Thought Content: Thought content normal.        Judgment: Judgment normal.       Results for orders placed or performed in visit on 09/10/20  CBC with Differential/Platelet  Result Value Ref Range   WBC 5.4 4.0 - 10.5 K/uL   RBC 4.63 3.87 - 5.11 Mil/uL   Hemoglobin 13.9 12.0 - 15.0 g/dL   HCT 40.2 36 - 46 %   MCV 86.7 78.0 - 100.0 fl   MCHC 34.5 30.0 - 36.0 g/dL   RDW 12.0 11.5 - 15.5 %   Platelets 294.0 150 - 400 K/uL   Neutrophils Relative % 59.2 43 - 77 %   Lymphocytes Relative 28.1 12 - 46 %   Monocytes Relative 8.5 3 - 12 %   Eosinophils  Relative 3.7 0 - 5 %   Basophils Relative 0.5 0 - 3 %   Neutro Abs 3.2 1.4 - 7.7 K/uL   Lymphs Abs 1.5 0.7 - 4.0 K/uL   Monocytes Absolute 0.5 0.1 - 1.0 K/uL   Eosinophils Absolute 0.2 0.0 - 0.7 K/uL   Basophils Absolute 0.0 0.0 - 0.1 K/uL  Basic metabolic panel  Result Value Ref Range  Sodium 140 135 - 145 mEq/L   Potassium 4.4 3.5 - 5.1 mEq/L   Chloride 107 96 - 112 mEq/L   CO2 27 19 - 32 mEq/L   Glucose, Bld 82 70 - 99 mg/dL   BUN 11 6 - 23 mg/dL   Creatinine, Ser 0.61 0.40 - 1.20 mg/dL   GFR 114.98 >60.00 mL/min   Calcium 9.0 8.4 - 10.5 mg/dL  TSH  Result Value Ref Range   TSH 1.56 0.35 - 4.50 uIU/mL   Lab Results  Component Value Date   CHOL 164 06/24/2019   HDL 52.70 06/24/2019   LDLCALC 98 06/24/2019   TRIG 66.0 06/24/2019   CHOLHDL 3 06/24/2019   Assessment & Plan:  This visit occurred during the SARS-CoV-2 public health emergency.  Safety protocols were in place, including screening questions prior to the visit, additional usage of staff PPE, and extensive cleaning of exam room while observing appropriate contact time as indicated for disinfecting solutions.   Problem List Items Addressed This Visit    Mass of left chest wall    Mobile cystic mass to left chest wall inferior to breast. Not consistent with LN or lipoma, not consistent with prominent cartilage (mass is in skin).  Chest wall soft tissue US returned normal 03/2020.  Screening mammo WNL 10/2019.  No change in mass but still palpable.  Discussed referral to gen surg for persistent discomfort, but will start with dx mammo/L breast US.  May try voltaren topically in interim.       Relevant Orders   US BREAST LTD UNI LEFT INC AXILLA   MM DIAG BREAST TOMO BILATERAL   US BREAST LTD UNI RIGHT INC AXILLA   Hypothyroidism    Chronic, stable. Continue current regimen.       Health care maintenance - Primary    Preventative protocols reviewed and updated unless pt declined. Discussed healthy diet and  lifestyle.       H/O radioactive iodine thyroid ablation    Other Visit Diagnoses    Need for influenza vaccination       Relevant Orders   Flu Vaccine QUAD 36+ mos IM (Completed)       No orders of the defined types were placed in this encounter.  Orders Placed This Encounter  Procedures  . US BREAST LTD UNI LEFT INC AXILLA    Standing Status:   Future    Standing Expiration Date:   09/18/2021    Order Specific Question:   Reason for Exam (SYMPTOM  OR DIAGNOSIS REQUIRED)    Answer:   left breast/chest wall lump 6 o clock    Order Specific Question:   Preferred imaging location?    Answer:   [facility]  . MM DIAG BREAST TOMO BILATERAL    Standing Status:   Future    Standing Expiration Date:   09/18/2021    Order Specific Question:   Reason for Exam (SYMPTOM  OR DIAGNOSIS REQUIRED)    Answer:   L inferior chest wall/breast mass    Order Specific Question:   Preferred imaging location?    Answer:   [facility]    Order Specific Question:   Is the patient pregnant?    Answer:   No  . US BREAST LTD UNI RIGHT INC AXILLA    Standing Status:   Future    Standing Expiration Date:   09/18/2021    Order Specific Question:   Reason for Exam (SYMPTOM  OR DIAGNOSIS REQUIRED)  Answer:   L inferior chest wall /breast mass    Order Specific Question:   Preferred imaging location?    Answer:   [facility]  . Flu Vaccine QUAD 36+ mos IM    Patient instructions; Flu shot today  For persistent discomfort lower chest wall - we will refer you to surgery for evaluation. In the interim, try voltaren (diclofenac) topical OTC anti inflammatory gel twice daily for 1-2 weeks.  You are doing well today Return as needed or in 1 year for next physical.   Follow up plan: Return in about 1 year (around 09/18/2021) for annual exam, prior fasting for blood work.  Ria Bush, MD

## 2020-09-18 NOTE — Assessment & Plan Note (Signed)
Preventative protocols reviewed and updated unless pt declined. Discussed healthy diet and lifestyle.  

## 2020-09-18 NOTE — Addendum Note (Signed)
Addended by: Ria Bush on: 09/18/2020 11:04 AM   Modules accepted: Orders

## 2020-09-18 NOTE — Assessment & Plan Note (Signed)
Chronic, stable. Continue current regimen. 

## 2020-09-20 ENCOUNTER — Other Ambulatory Visit: Payer: Self-pay | Admitting: Family Medicine

## 2020-09-26 ENCOUNTER — Other Ambulatory Visit: Payer: Self-pay

## 2020-09-26 ENCOUNTER — Other Ambulatory Visit: Payer: Self-pay | Admitting: Family Medicine

## 2020-09-26 ENCOUNTER — Ambulatory Visit
Admission: RE | Admit: 2020-09-26 | Discharge: 2020-09-26 | Disposition: A | Discharge status: Home / Self Care | Payer: 59 | Source: Ambulatory Visit | Attending: Family Medicine | Admitting: Family Medicine

## 2020-09-26 ENCOUNTER — Other Ambulatory Visit: Payer: Self-pay | Admitting: Internal Medicine

## 2020-09-26 DIAGNOSIS — R0789 Other chest pain: Secondary | ICD-10-CM

## 2020-09-26 DIAGNOSIS — R222 Localized swelling, mass and lump, trunk: Secondary | ICD-10-CM

## 2020-10-02 ENCOUNTER — Ambulatory Visit (INDEPENDENT_AMBULATORY_CARE_PROVIDER_SITE_OTHER): Payer: 59 | Admitting: Surgery

## 2020-10-02 ENCOUNTER — Encounter: Payer: Self-pay | Admitting: Surgery

## 2020-10-02 ENCOUNTER — Other Ambulatory Visit: Payer: Self-pay

## 2020-10-02 VITALS — BP 107/73 | HR 66 | Temp 98.1°F | Ht 65.0 in | Wt 127.0 lb

## 2020-10-02 DIAGNOSIS — R222 Localized swelling, mass and lump, trunk: Secondary | ICD-10-CM

## 2020-10-02 NOTE — Patient Instructions (Addendum)
Lipoma  A lipoma is a noncancerous (benign) tumor that is made up of fat cells. This is a very common type of soft-tissue growth. Lipomas are usually found under the skin (subcutaneous). They may occur in any tissue of the body that contains fat. Common areas for lipomas to appear include the back, arms, shoulders, buttocks, and thighs. Lipomas grow slowly, and they are usually painless. Most lipomas do not cause problems and do not require treatment. What are the causes? The cause of this condition is not known. What increases the risk? You are more likely to develop this condition if:  You are 40-60 years old.  You have a family history of lipomas. What are the signs or symptoms? A lipoma usually appears as a small, round bump under the skin. In most cases, the lump will:  Feel soft or rubbery.  Not cause pain or other symptoms. However, if a lipoma is located in an area where it pushes on nerves, it can become painful or cause other symptoms. How is this diagnosed? A lipoma can usually be diagnosed with a physical exam. You may also have tests to confirm the diagnosis and to rule out other conditions. Tests may include:  Imaging tests, such as a CT scan or an MRI.  Removal of a tissue sample to be looked at under a microscope (biopsy). How is this treated? Treatment for this condition depends on the size of the lipoma and whether it is causing any symptoms.  For small lipomas that are not causing problems, no treatment is needed.  If a lipoma is bigger or it causes problems, surgery may be done to remove the lipoma. Lipomas can also be removed to improve appearance. Most often, the procedure is done after applying a medicine that numbs the area (local anesthetic).  Liposuction may be done to reduce the size of the lipoma before it is removed through surgery, or it may be done to remove the lipoma. Lipomas are removed with this method in order to limit incision size and scarring. A  liposuction tube is inserted through a small incision into the lipoma, and the contents of the lipoma are removed through the tube with suction. Follow these instructions at home:  Watch your lipoma for any changes.  Keep all follow-up visits as told by your health care provider. This is important. Contact a health care provider if:  Your lipoma becomes larger or hard.  Your lipoma becomes painful, red, or increasingly swollen. These could be signs of infection or a more serious condition. Get help right away if:  You develop tingling or numbness in an area near the lipoma. This could indicate that the lipoma is causing nerve damage. Summary  A lipoma is a noncancerous tumor that is made up of fat cells.  Most lipomas do not cause problems and do not require treatment.  If a lipoma is bigger or it causes problems, surgery may be done to remove the lipoma.  Contact a health care provider if your lipoma becomes larger or hard, or if it becomes painful, red, or increasingly swollen. Pain, redness, and swelling could be signs of infection or a more serious condition. This information is not intended to replace advice given to you by your health care provider. Make sure you discuss any questions you have with your health care provider. Document Revised: 05/23/2019 Document Reviewed: 05/23/2019 Elsevier Patient Education  2020 Elsevier Inc[employer]  

## 2020-10-02 NOTE — Progress Notes (Signed)
Patient ID: Sheena Harris, female   DOB: [birth year], 34 y.o.   MRN: [id number]  Chief Complaint: Inframammary left chest wall pain  History of Present Illness Sheena Harris is a 34 y.o. female with sporadic pain involving the bra line of the medial left chest wall.  Extra concerns raised because her mother had contracted cancer in her 18s.  Patient reports a cramp-like pain radiating about 5 inches around in the area which will come spontaneously at times.  Resolves on its own.  Appears to be related to the costal margin on the left side.  Evaluation by ultrasound has been done which showed nothing.  She underwent menses at the age of 2.  She had 1 pregnancy at the age of 65.  She did nurse her baby.  She otherwise has not felt a lump does monthly breast exams.  Past Medical History Past Medical History:  Diagnosis Date  . H/O radioactive iodine thyroid ablation   . Hypothyroidism    s/p radioactive iodine therapy for hyperthyroidism      Past Surgical History:  Procedure Laterality Date  . KNEE ARTHROSCOPY Right 2002, 2003   meniscal surgery x2  . LASIK  12/2015  . NASAL SEPTUM SURGERY  2002    Allergies  Allergen Reactions  . Codeine Nausea Only    Current Outpatient Medications  Medication Sig Dispense Refill  . EUCRISA 2 % OINT Apply topically daily.    Marland Kitchen levothyroxine (SYNTHROID) 150 MCG tablet TAKE 1 TABLET BY MOUTH EVERY DAY 90 tablet 3   No current facility-administered medications for this visit.    Family History Family History  Problem Relation Age of Onset  . Epilepsy Father   . Cancer Mother 28       breast, s/p mastectomy, BRCA negative  . Hypertension Mother   . Hyperthyroidism Mother   . Breast cancer Mother   . Cancer Other        breast, maternal aunt  . Cancer Paternal Grandfather        smoker  . Stroke Maternal Grandfather 45       nonsmoker  . Diabetes Neg Hx   . CAD Neg Hx       Social History Social History   Tobacco Use  .  Smoking status: Never Smoker  . Smokeless tobacco: Never Used  Substance Use Topics  . Alcohol use: Yes    Comment: occasionally  . Drug use: No        Review of Systems  Constitutional: Negative.   HENT: Negative.   Eyes: Negative.   Respiratory: Negative.   Cardiovascular: Negative.   Gastrointestinal: Negative.   Genitourinary: Negative.   Skin: Negative.   Neurological: Negative.   Psychiatric/Behavioral: Negative.       Physical Exam Blood pressure 107/73, pulse 66, temperature 98.1 F (36.7 C), temperature source Oral, height $RemoveBefo'5\' 5"'XRkANhENJHb$  (1.651 m), weight 127 lb (57.6 kg), last menstrual period 09/07/2020, SpO2 98 %. Last Weight  Most recent update: 10/02/2020  2:16 PM   Weight  57.6 kg (127 lb)            CONSTITUTIONAL: Well developed, and nourished, appropriately responsive and aware without distress.   EYES: Sclera non-icteric.   EARS, NOSE, MOUTH AND THROAT: Mask worn.    Hearing is intact to voice.  NECK: Trachea is midline, and there is no jugular venous distension.  LYMPH NODES:  Lymph nodes in the neck are not enlarged. RESPIRATORY:  Lungs are clear, and breath sounds  are equal bilaterally. Normal respiratory effort without pathologic use of accessory muscles. CARDIOVASCULAR: Heart is regular in rate and rhythm. GI: The abdomen is  soft, nontender, and nondistended.  GU: Left medial infra-mammary area is a rolling 2+cm lipoma that rolls longitudinally over the underlying rib. MUSCULOSKELETAL:  Symmetrical muscle tone appreciated in all four extremities.    SKIN: Skin turgor is normal. No pathologic skin lesions appreciated.  NEUROLOGIC:  Motor and sensation appear grossly normal.  Cranial nerves are grossly without defect. PSYCH:  Alert and oriented to person, place and time. Affect is appropriate for situation.  Data Reviewed I have personally reviewed what is currently available of the patient's imaging, recent labs and medical records.   Labs:  CBC  Latest Ref Rng & Units 09/10/2020 01/24/2008 04/29/2007  WBC 4.0 - 10.5 K/uL 5.4 6.4 4.3(L)  Hemoglobin 12.0 - 15.0 g/dL 13.9 12.8 12.9  Hematocrit 36.0 - 46.0 % 40.2 38.6 38.4  Platelets 150.0 - 400.0 K/uL 294.0 305 270   CMP Latest Ref Rng & Units 09/10/2020 03/18/2018 09/15/2016  Glucose 70 - 99 mg/dL 82 82 87  BUN 6 - 23 mg/dL $Remove'11 10 9  'FrYmjhn$ Creatinine 0.40 - 1.20 mg/dL 0.61 0.68 0.67  Sodium 135 - 145 mEq/L 140 137 139  Potassium 3.5 - 5.1 mEq/L 4.4 4.5 4.1  Chloride 96 - 112 mEq/L 107 104 106  CO2 19 - 32 mEq/L $Remove'27 23 27  'DSouPPM$ Calcium 8.4 - 10.5 mg/dL 9.0 9.9 9.0  Total Protein 6.0 - 8.3 g/dL - - -  Total Bilirubin 0.3 - 1.2 mg/dL - - -  Alkaline Phos 39 - 117 U/L - - -  AST 0 - 37 U/L - - -  ALT 0 - 35 U/L - - -      Imaging: Reports noted.  Within last 24 hrs: No results found.  Assessment    Lipomatous mass of left chest wall.  Patient Active Problem List   Diagnosis Date Noted  . Mass of left chest wall 04/09/2020  . Health care maintenance 07/28/2014  . H/O radioactive iodine thyroid ablation   . Left-sided chest wall pain 11/30/2009  . Hypothyroidism 04/29/2007    Plan    Offered excision under local anesthesia which she has adopted as her plan.  Alternatives of proceeding to the OR were also discussed. Risks and benefits discussed including local anesthetic alone, hematoma formation, possible infection.  Removing the lipoma may not entirely alleviate this pain syndrome, but we anticipate it may be the culprit.  Questions answered, no guarantees were ever expressed or implied.  Face-to-face time spent with the patient and accompanying care providers(if present) was 20 minutes, with more than 50% of the time spent counseling, educating, and coordinating care of the patient.      Ronny Bacon M.D., FACS 10/02/2020, 2:45 PM

## 2020-10-09 ENCOUNTER — Other Ambulatory Visit: Payer: Self-pay | Admitting: Surgery

## 2020-10-09 ENCOUNTER — Other Ambulatory Visit: Payer: Self-pay

## 2020-10-09 ENCOUNTER — Encounter: Payer: Self-pay | Admitting: Surgery

## 2020-10-09 ENCOUNTER — Ambulatory Visit (INDEPENDENT_AMBULATORY_CARE_PROVIDER_SITE_OTHER): Payer: 59 | Admitting: Surgery

## 2020-10-09 VITALS — BP 131/74 | HR 76 | Temp 98.2°F | Ht 65.0 in | Wt 128.4 lb

## 2020-10-09 DIAGNOSIS — R222 Localized swelling, mass and lump, trunk: Secondary | ICD-10-CM

## 2020-10-09 DIAGNOSIS — R0789 Other chest pain: Secondary | ICD-10-CM

## 2020-10-09 NOTE — Progress Notes (Signed)
Excision of symptomatic left costal lipoma  Pre-operative Diagnosis: Left inframammary lipoma, symptomatic  Post-operative Diagnosis: same.   Surgeon: Ronny Bacon, M.D., FACS  Anesthesia: Local  Findings: Mature 3 cm mass of lipomatous tissue, with septation and rolling over rib at inframammary crease  Estimated Blood Loss: 10 mL         Specimens: As above          Complications: none              Procedure Details  The benefits, complications, treatment options, and expected outcomes were discussed with the patient. The risks of bleeding, infection, recurrence of symptoms, failure to resolve symptoms, unanticipated injury, any of which could require further surgery were reviewed with the patient. The likelihood of improving the patient's symptoms with return to their baseline status is good.  The patient and/or family concurred with the proposed plan, giving informed consent.  The patient was taken to procedure room 9, identified and the procedure verified.    Prior to the induction of anesthesia, no antibiotic prophylaxis was indicated.  The patient was positioned in the supine position and the left chest wall was prepped with Chloraprep and draped in the sterile fashion.  A Time Out was held and the above information confirmed.  The area was marked. Local anesthesia of 1% lidocaine with epinephrine infiltrated in the region to adequate effect. A horizontal incision was made at the marked site, incision extended through the subcutaneous tissues to the lipomatous tissue. Hemostasis with combination of pressure and battery-powered cautery. Circumferentially excised. Pressure applied for hemostasis. Incision was then closed with a running 4-0 Monocryl subcuticular. Sealed with Dermabond.  Procedure well-tolerated. Cautions given for maintaining pressure over the area. Risk of hematoma formation discussed.    Ronny Bacon M.D., [facility] 10/09/2020 11:25  AM

## 2020-10-09 NOTE — Patient Instructions (Signed)
You may take tylenol and ibuprofen for pain. If you have any concerns or questions, feel free to call our office.     Wound Care, Adult Taking care of your wound properly can help to prevent pain, infection, and scarring. It can also help your wound to heal more quickly. How to care for your wound Wound care      Follow instructions from your health care provider about how to take care of your wound. Make sure you: ? Wash your hands with soap and water before you change the bandage (dressing). If soap and water are not available, use hand sanitizer. ? Change your dressing as told by your health care provider. ? Leave stitches (sutures), skin glue, or adhesive strips in place. These skin closures may need to stay in place for 2 weeks or longer. If adhesive strip edges start to loosen and curl up, you may trim the loose edges. Do not remove adhesive strips completely unless your health care provider tells you to do that.  Check your wound area every day for signs of infection. Check for: ? Redness, swelling, or pain. ? Fluid or blood. ? Warmth. ? Pus or a bad smell.  Ask your health care provider if you should clean the wound with mild soap and water. Doing this may include: ? Using a clean towel to pat the wound dry after cleaning it. Do not rub or scrub the wound. ? Applying a cream or ointment. Do this only as told by your health care provider. ? Covering the incision with a clean dressing.  Ask your health care provider when you can leave the wound uncovered.  Keep the dressing dry until your health care provider says it can be removed. Do not take baths, swim, use a hot tub, or do anything that would put the wound underwater until your health care provider approves. Ask your health care provider if you can take showers. You may only be allowed to take sponge baths. Medicines   If you were prescribed an antibiotic medicine, cream, or ointment, take or use the antibiotic as told by  your health care provider. Do not stop taking or using the antibiotic even if your condition improves.  Take over-the-counter and prescription medicines only as told by your health care provider. If you were prescribed pain medicine, take it 30 or more minutes before you do any wound care or as told by your health care provider. General instructions  Return to your normal activities as told by your health care provider. Ask your health care provider what activities are safe.  Do not scratch or pick at the wound.  Do not use any products that contain nicotine or tobacco, such as cigarettes and e-cigarettes. These may delay wound healing. If you need help quitting, ask your health care provider.  Keep all follow-up visits as told by your health care provider. This is important.  Eat a diet that includes protein, vitamin A, vitamin C, and other nutrient-rich foods to help the wound heal. ? Foods rich in protein include meat, dairy, beans, nuts, and other sources. ? Foods rich in vitamin A include carrots and dark green, leafy vegetables. ? Foods rich in vitamin C include citrus, tomatoes, and other fruits and vegetables. ? Nutrient-rich foods have protein, carbohydrates, fat, vitamins, or minerals. Eat a variety of healthy foods including vegetables, fruits, and whole grains. Contact a health care provider if:  You received a tetanus shot and you have swelling, severe pain, redness,  or bleeding at the injection site.  Your pain is not controlled with medicine.  You have redness, swelling, or pain around the wound.  You have fluid or blood coming from the wound.  Your wound feels warm to the touch.  You have pus or a bad smell coming from the wound.  You have a fever or chills.  You are nauseous or you vomit.  You are dizzy. Get help right away if:  You have a red streak going away from your wound.  The edges of the wound open up and separate.  Your wound is bleeding, and the  bleeding does not stop with gentle pressure.  You have a rash.  You faint.  You have trouble breathing. Summary  Always wash your hands with soap and water before changing your bandage (dressing).  To help with healing, eat foods that are rich in protein, vitamin A, vitamin C, and other nutrients.  Check your wound every day for signs of infection. Contact your health care provider if you suspect that your wound is infected. This information is not intended to replace advice given to you by your health care provider. Make sure you discuss any questions you have with your health care provider. Document Revised: 01/24/2019 Document Reviewed: 04/22/2016 Elsevier Patient Education  2020 [employer].

## 2021-09-12 ENCOUNTER — Other Ambulatory Visit: Payer: Self-pay | Admitting: Family Medicine

## 2021-09-17 NOTE — Telephone Encounter (Signed)
Please call patient and schedule annual physical. 

## 2021-09-17 NOTE — Telephone Encounter (Signed)
Pt schedule a cpe/lab in march 2023 pt stated also can she get refill on levothyroxine

## 2021-09-25 ENCOUNTER — Telehealth: Payer: Self-pay | Admitting: Family Medicine

## 2021-11-19 IMAGING — MG DIGITAL SCREENING BILAT W/ TOMO W/ CAD
8 series · 9 of 24 positions shown · non-contrast
Comparison: Previous exam(s).

CLINICAL DATA: Screening.

EXAM:
DIGITAL SCREENING BILATERAL MAMMOGRAM WITH TOMO AND CAD

[R MLO synth-2D]
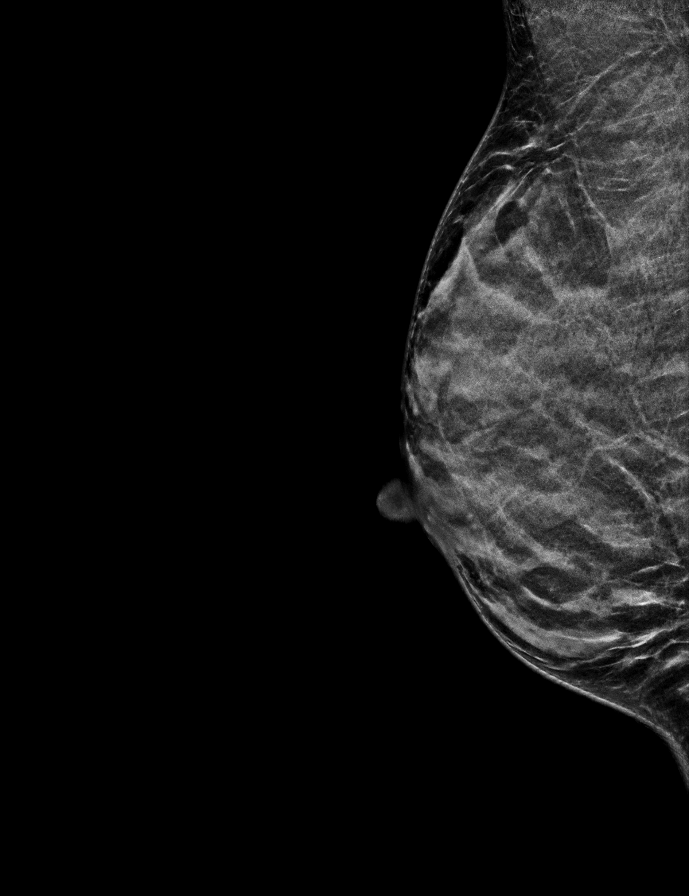

[L CC synth-2D]
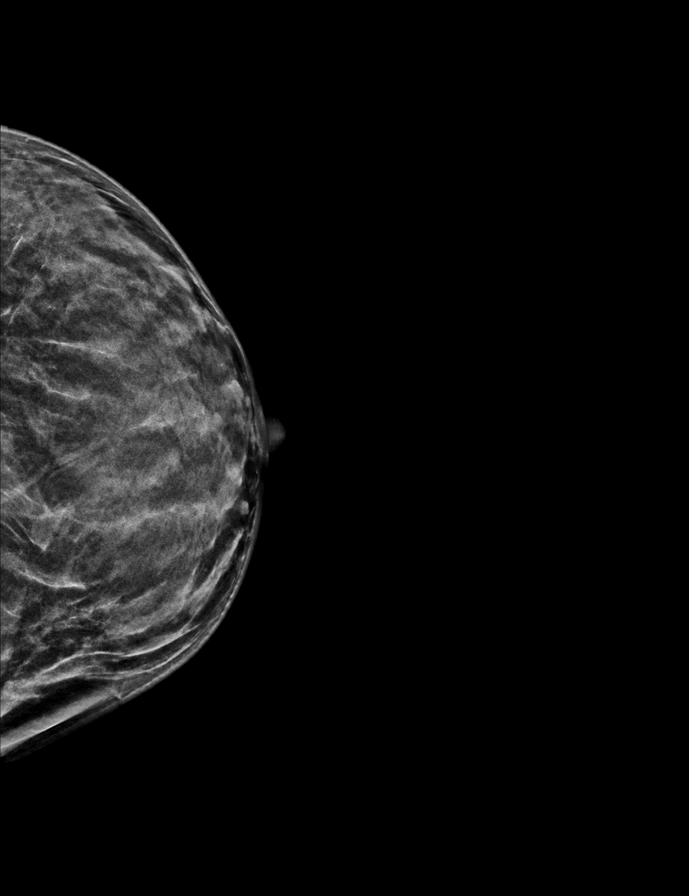

[R CC synth-2D]
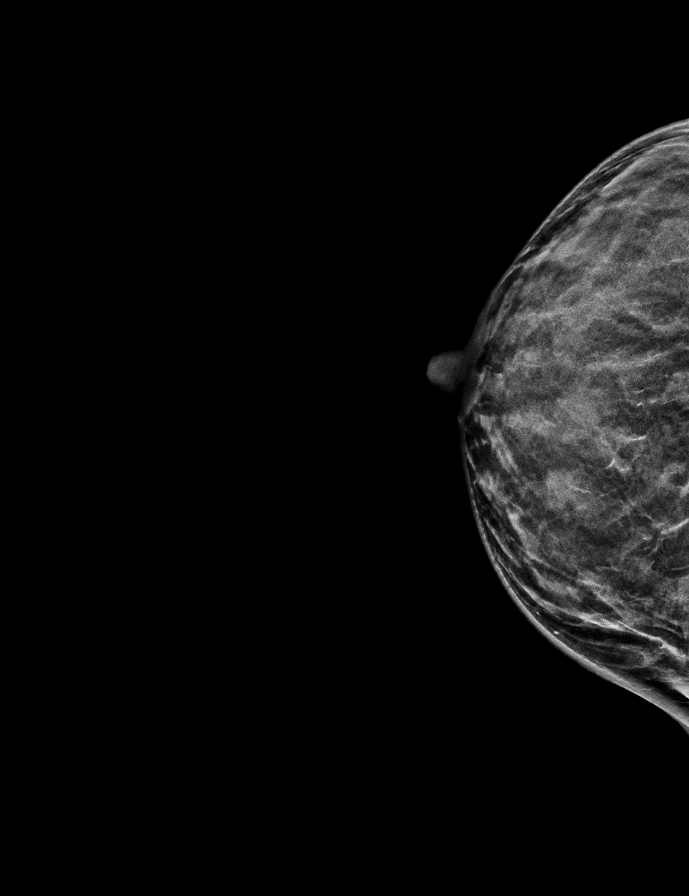

[L MLO synth-2D]
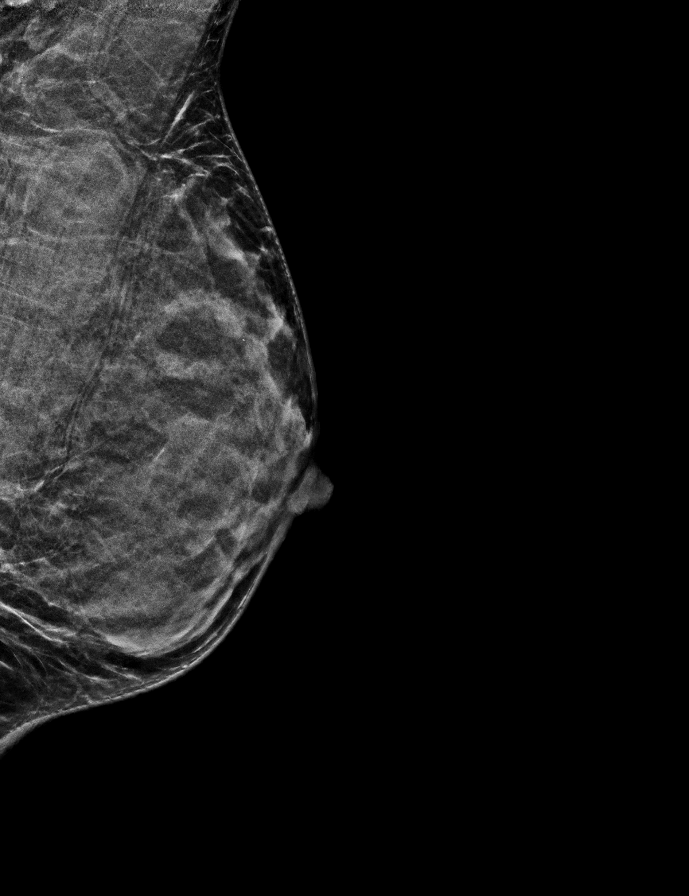

[L CC tomo · 2 of 39 frames shown]
[frame 13/39]
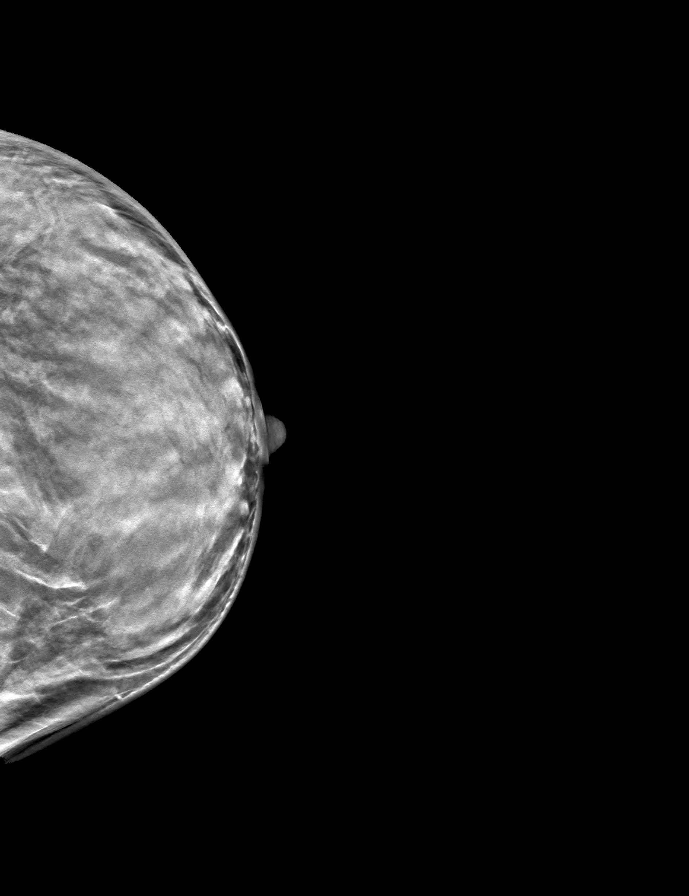
[frame 20/39]
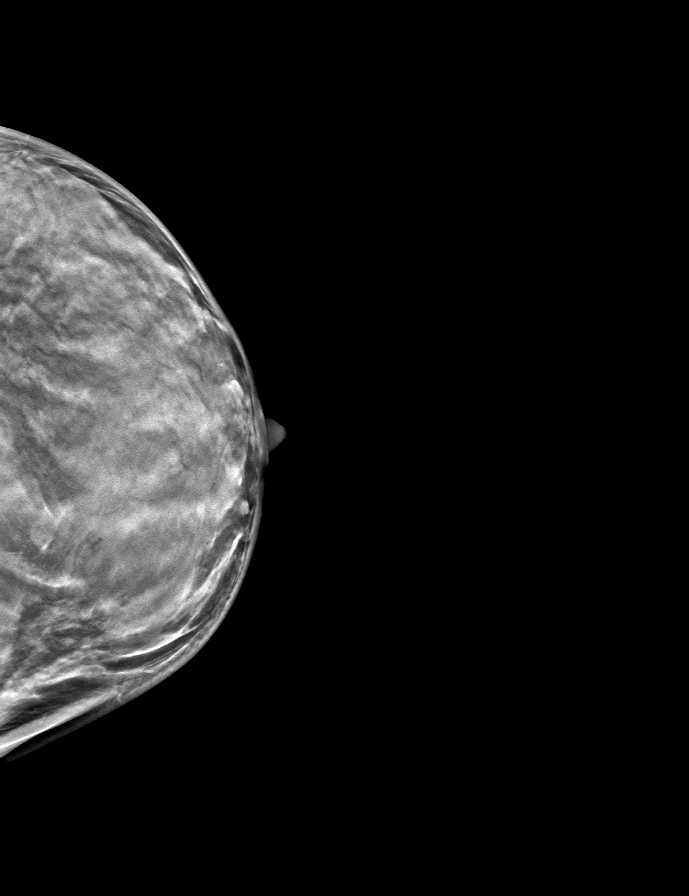

[R CC tomo · tomo slice 17/33.0]
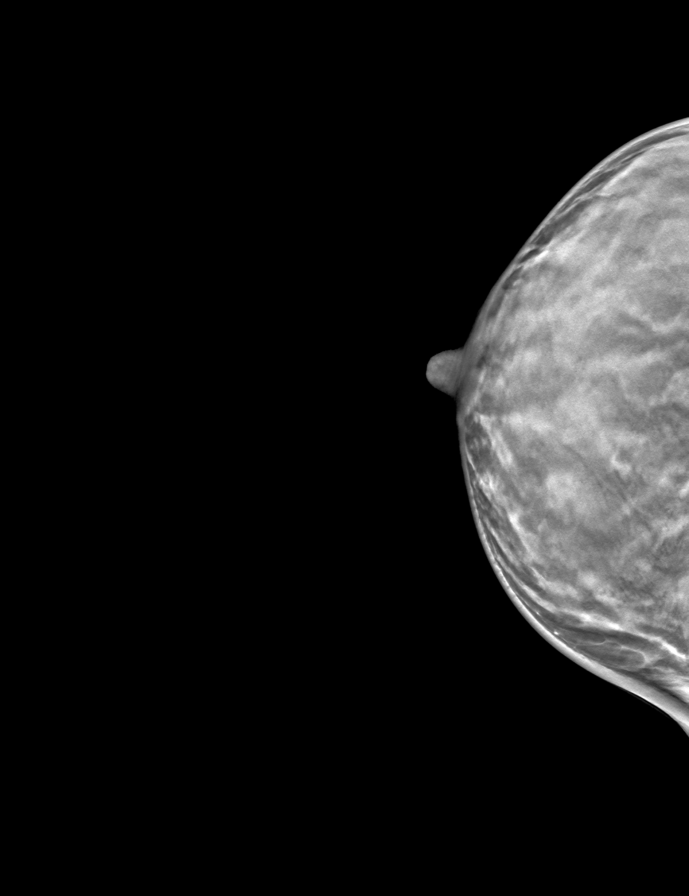

[L MLO tomo · tomo slice 19/36.0]
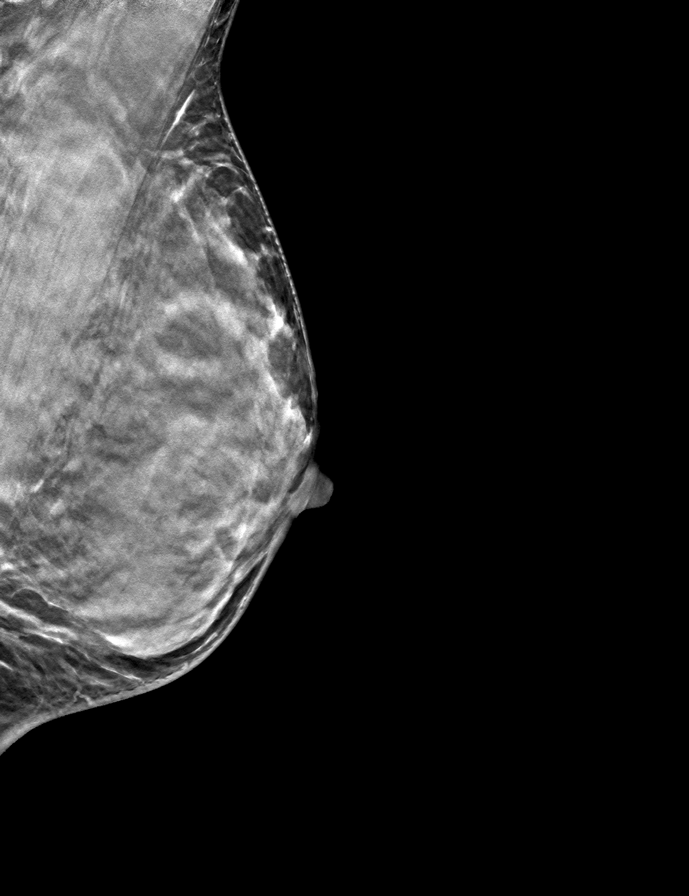

[R MLO tomo · tomo slice 17/34.0]
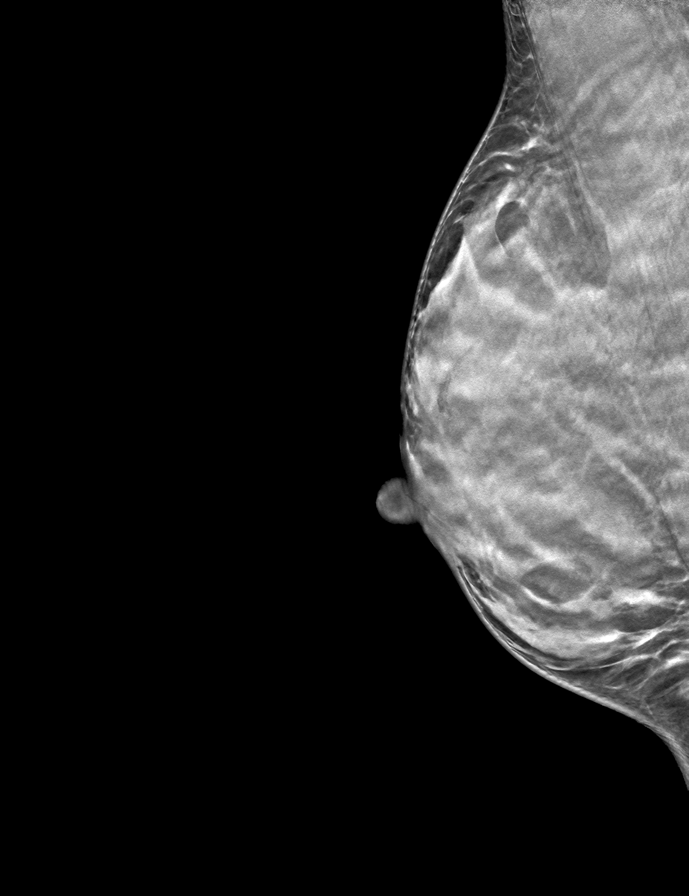

[9 of 24 positions shown; findings below may reference images not displayed]

ACR Breast Density Category d: The breast tissue is extremely dense,
which lowers the sensitivity of mammography
FINDINGS: There are no findings suspicious for malignancy. Images were
processed with CAD.
IMPRESSION: No mammographic evidence of malignancy. A result letter of this
screening mammogram will be mailed directly to the patient.

RECOMMENDATION:
Screening mammogram at age 38. (Code:[id number])

BI-RADS CATEGORY  1: Negative.

## 2021-12-02 ENCOUNTER — Telehealth: Payer: 59 | Admitting: Physician Assistant

## 2021-12-02 ENCOUNTER — Telehealth: Payer: 59 | Admitting: Nurse Practitioner

## 2021-12-02 DIAGNOSIS — J0140 Acute pansinusitis, unspecified: Secondary | ICD-10-CM | POA: Diagnosis not present

## 2021-12-02 DIAGNOSIS — J019 Acute sinusitis, unspecified: Secondary | ICD-10-CM

## 2021-12-02 MED ORDER — AMOXICILLIN 500 MG PO CAPS: 500.0000 mg | ORAL_CAPSULE | Freq: Two times a day (BID) | ORAL | 0 refills | Status: AC

## 2021-12-02 NOTE — Progress Notes (Signed)
E-Visit for Sinus Problems  We are sorry that you are not feeling well.  Here is how we plan to help!  Based on what you have shared with me it looks like you have sinusitis.  Sinusitis is inflammation and infection in the sinus cavities of the head.  Based on your presentation I believe you most likely have Acute Bacterial Sinusitis.  This is an infection caused by bacteria and is treated with antibiotics. I have prescribed Amoxicillin 500mg  Twice daily for 10 days. You may use an oral decongestant such as Mucinex D or if you have glaucoma or high blood pressure use plain Mucinex. Saline nasal spray help and can safely be used as often as needed for congestion.  If you develop worsening sinus pain, fever or notice severe headache and vision changes, or if symptoms are not better after completion of antibiotic, please schedule an appointment with a health care provider.    Sinus infections are not as easily transmitted as other respiratory infection, however we still recommend that you avoid close contact with loved ones, especially the very young and elderly.  Remember to wash your hands thoroughly throughout the day as this is the number one way to prevent the spread of infection!  Home Care: Only take medications as instructed by your medical team. Complete the entire course of an antibiotic. Do not take these medications with alcohol. A steam or ultrasonic humidifier can help congestion.  You can place a towel over your head and breathe in the steam from hot water coming from a faucet. Avoid close contacts especially the very young and the elderly. Cover your mouth when you cough or sneeze. Always remember to wash your hands.  Get Help Right Away If: You develop worsening fever or sinus pain. You develop a severe head ache or visual changes. Your symptoms persist after you have completed your treatment plan.  Make sure you Understand these instructions. Will watch your condition. Will  get help right away if you are not doing well or get worse.  Thank you for choosing an e-visit.  Your e-visit answers were reviewed by a board certified advanced clinical practitioner to complete your personal care plan. Depending upon the condition, your plan could have included both over the counter or prescription medications.  Please review your pharmacy choice. Make sure the pharmacy is open so you can pick up prescription now. If there is a problem, you may contact your provider through [employer] and have the prescription routed to another pharmacy.  Your safety is important to [place]. If you have drug allergies check your prescription carefully.   For the next 24 hours you can use MyChart to ask questions about today's visit, request a non-urgent call back, or ask for a work or school excuse. You will get an email in the next two days asking about your experience. I hope that your e-visit has been valuable and will speed your recovery.  I provided 5 minutes of non face-to-face time during this encounter for chart review and documentation.

## 2021-12-02 NOTE — Progress Notes (Signed)
E-Visit for Sinus Problems  We are sorry that you are not feeling well.  Here is how we plan to help!  Based on what you have shared with me it looks like you have sinusitis.  Sinusitis is inflammation and infection in the sinus cavities of the head.  Based on your presentation I believe you most likely have Acute Viral Sinusitis.This is an infection most likely caused by a virus. There is not specific treatment for viral sinusitis other than to help you with the symptoms until the infection runs its course.  You may use an oral decongestant such as Mucinex D or if you have glaucoma or high blood pressure use plain Mucinex. Saline nasal spray help and can safely be used as often as needed for congestion, I can safely continue Fluticasone nasal spray two sprays in each nostril once a day, this is available over the counter if this is not the current nasal spray you are using.  You can check with your OBGYN or pediatrician, most of the time they are OK with you using a Mucinex product to help with congestion as well. You will need to increase your water intake to assure the decongestant does not dry up your mild production.    We typically do not recommend antibiotics earlier than 7-10 into a sinus infection as the early stages are from allergies or a virus and do not respond to antibiotic treatment.   Providers prescribe antibiotics to treat infections caused by bacteria. Antibiotics are very powerful in treating bacterial infections when they are used properly. To maintain their effectiveness, they should be used only when necessary. Overuse of antibiotics has resulted in the development of superbugs that are resistant to treatment!    After careful review of your answers, I would not recommend an antibiotic for your condition.  Antibiotics are not effective against viruses and therefore should not be used to treat them. Common examples of infections caused by viruses include colds and flu   Some  authorities believe that zinc sprays or the use of Echinacea may shorten the course of your symptoms.  Sinus infections are not as easily transmitted as other respiratory infection, however we still recommend that you avoid close contact with loved ones, especially the very young and elderly.  Remember to wash your hands thoroughly throughout the day as this is the number one way to prevent the spread of infection!  Home Care: Only take medications as instructed by your medical team. Do not take these medications with alcohol. A steam or ultrasonic humidifier can help congestion.  You can place a towel over your head and breathe in the steam from hot water coming from a faucet. Avoid close contacts especially the very young and the elderly. Cover your mouth when you cough or sneeze. Always remember to wash your hands.  Get Help Right Away If: You develop worsening fever or sinus pain. You develop a severe head ache or visual changes. Your symptoms persist after you have completed your treatment plan.  Make sure you Understand these instructions. Will watch your condition. Will get help right away if you are not doing well or get worse.   Thank you for choosing an e-visit.  Your e-visit answers were reviewed by a board certified advanced clinical practitioner to complete your personal care plan. Depending upon the condition, your plan could have included both over the counter or prescription medications.  Please review your pharmacy choice. Make sure the pharmacy is open so you  can pick up prescription now. If there is a problem, you may contact your provider through [employer] and have the prescription routed to another pharmacy.  Your safety is important to [place]. If you have drug allergies check your prescription carefully.   For the next 24 hours you can use MyChart to ask questions about today's visit, request a non-urgent call back, or ask for a work or school excuse. You will  get an email in the next two days asking about your experience. I hope that your e-visit has been valuable and will speed your recovery.  I spent approximately 7 minutes reviewing the patient's history, current symptoms and coordinating their plan of care today.

## 2021-12-15 ENCOUNTER — Other Ambulatory Visit: Payer: Self-pay | Admitting: Family Medicine

## 2021-12-23 ENCOUNTER — Other Ambulatory Visit: Payer: Self-pay | Admitting: Family Medicine

## 2021-12-23 DIAGNOSIS — Z1159 Encounter for screening for other viral diseases: Secondary | ICD-10-CM

## 2021-12-23 DIAGNOSIS — Z131 Encounter for screening for diabetes mellitus: Secondary | ICD-10-CM

## 2021-12-23 DIAGNOSIS — E039 Hypothyroidism, unspecified: Secondary | ICD-10-CM

## 2021-12-24 ENCOUNTER — Other Ambulatory Visit (INDEPENDENT_AMBULATORY_CARE_PROVIDER_SITE_OTHER): Payer: 59

## 2021-12-24 ENCOUNTER — Other Ambulatory Visit: Payer: Self-pay

## 2021-12-24 DIAGNOSIS — E039 Hypothyroidism, unspecified: Secondary | ICD-10-CM

## 2021-12-24 DIAGNOSIS — Z131 Encounter for screening for diabetes mellitus: Secondary | ICD-10-CM

## 2021-12-24 DIAGNOSIS — Z1159 Encounter for screening for other viral diseases: Secondary | ICD-10-CM | POA: Diagnosis not present

## 2021-12-24 LAB — TSH: TSH: 2.55 u[IU]/mL (ref 0.35–5.50)

## 2021-12-24 LAB — BASIC METABOLIC PANEL
BUN: 14 mg/dL (ref 6–23)
CO2: 27 mEq/L (ref 19–32)
Calcium: 9.4 mg/dL (ref 8.4–10.5)
Chloride: 104 mEq/L (ref 96–112)
Creatinine, Ser: 0.68 mg/dL (ref 0.40–1.20)
GFR: 111 mL/min (ref >60.00)
Glucose, Bld: 67 mg/dL — ABNORMAL LOW (ref 70–99)
Potassium: 4.6 mEq/L (ref 3.5–5.1)
Sodium: 138 mEq/L (ref 135–145)

## 2021-12-31 ENCOUNTER — Ambulatory Visit (INDEPENDENT_AMBULATORY_CARE_PROVIDER_SITE_OTHER): Payer: 59 | Admitting: Family Medicine

## 2021-12-31 ENCOUNTER — Other Ambulatory Visit: Payer: Self-pay

## 2021-12-31 ENCOUNTER — Encounter: Payer: Self-pay | Admitting: Family Medicine

## 2021-12-31 VITALS — BP 112/64 | HR 74 | Temp 97.7°F | Ht 64.25 in | Wt 135.1 lb

## 2021-12-31 DIAGNOSIS — Z923 Personal history of irradiation: Secondary | ICD-10-CM

## 2021-12-31 DIAGNOSIS — Z0000 Encounter for general adult medical examination without abnormal findings: Secondary | ICD-10-CM

## 2021-12-31 DIAGNOSIS — E039 Hypothyroidism, unspecified: Secondary | ICD-10-CM

## 2021-12-31 NOTE — Progress Notes (Signed)
? ? Patient ID: Sheena Harris, female    DOB: 05/08/1986, 36 y.o.   MRN: [id number] ? ?This visit was conducted in person. ? ?BP 112/64   Pulse 74   Temp 97.7 ?F (36.5 ?C) (Temporal)   Ht 5' 4.25" (1.632 m)   Wt 135 lb 2 oz (61.3 kg)   SpO2 98%   Breastfeeding Yes   BMI 23.01 kg/m?   ? ?CC: CPE ?Subjective:  ? ?HPI: ?Sheena Harris is a 36 y.o. female presenting on 12/31/2021 for Annual Exam ? ? ?Last seen 08/2020.  ?S/p 2nd delivery 08/2021.  ? ?Mass of L chest wall = lipoma s/p removal by gen surgery 09/2020.  ?  ?No new joint pains, new skin rash, red eyes.  ?  ?Preventative: ?Well woman with OBGYN at [facility], OCP through their office. Normal pap smears. Sees yearly last seen 09/2021.  ?Mammogram 09/2020 Birads1 @ [facility] ?Flu - today ?COVID vaccine Moderna 12/2019, 01/2020, booster 09/2020, bivalent 07/2021 ?Tdap 07/2014, 09/2017, 2022  ?Seat belt use discussed ?Sunscreen use discussed. No changing moles on skin. Sees derm regularly.  ?Sleep - averaging 6-7 hours/night ?Non smoker  ?Alcohol - rare  ?Dentist - Q6 months ?Eye exam - about yearly ?  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   ? ?Relevant past medical, surgical, family and social history reviewed and updated as indicated. Interim medical history since our last visit reviewed. ?Allergies and medications reviewed and updated. ?Outpatient Medications Prior to Visit  ?Medication Sig Dispense Refill  ? EUCRISA 2 % OINT Apply topically daily.    ? levothyroxine (SYNTHROID) 125 MCG tablet Take 125 mcg by mouth daily.    ? norethindrone (MICRONOR) 0.35 MG tablet Take 1 tablet by mouth daily.    ? Prenatal Vit-DSS-Fe Cbn-FA (PRENATAL AD PO) Take by mouth daily.    ? levothyroxine (SYNTHROID) 150 MCG tablet TAKE 1 TABLET BY MOUTH  EVERY DAY 90 tablet 0  ? ?No facility-administered medications prior to visit.  ?  ? ?Per HPI unless specifically indicated in ROS section below ?Review of Systems  ?Constitutional:  Negative for activity change, appetite change, chills, fatigue, fever and unexpected weight change.  ?HENT:  Negative for hearing loss.   ?Eyes:  Negative for visual disturbance.  ?Respiratory:  Negative for cough, chest tightness, shortness of breath and wheezing.   ?Cardiovascular:  Negative for chest pain, palpitations and leg swelling.  ?Gastrointestinal:  Negative for abdominal distention, abdominal pain, blood in stool, constipation, diarrhea, nausea and vomiting.  ?Genitourinary:  Negative for difficulty urinating and hematuria.  ?Musculoskeletal:  Negative for arthralgias, myalgias and neck pain.  ?Skin:  Negative for rash.  ?Neurological:  Negative for dizziness, seizures, syncope and headaches.  ?Hematological:  Negative for adenopathy. Does not bruise/bleed easily.  ?Psychiatric/Behavioral:  Negative for dysphoric mood. The patient is not nervous/anxious.   ? ?Objective:  ?BP 112/64   Pulse 74   Temp 97.7 ?F (36.5 ?C) (Temporal)   Ht 5' 4.25" (1.632 m)   Wt 135 lb 2 oz (61.3 kg)   SpO2 98%   Breastfeeding Yes   BMI 23.01 kg/m?   ?Wt Readings from Last 3 Encounters:  ?12/31/21 135 lb 2 oz (61.3 kg)  ?10/09/20 128 lb 6.4 oz (58.2 kg)  ?10/02/20 127 lb (57.6  kg)  ?  ?  ?Physical Exam ?Vitals and nursing note reviewed.  ?Constitutional:   ?   Appearance: Normal appearance. She is not ill-appearing.  ?HENT:  ?   Head: Normocephalic and atraumatic.  ?   Right Ear: Tympanic membrane, ear canal and external ear normal. There is no impacted cerumen.  ?   Left Ear: Tympanic membrane, ear canal and external ear normal. There is no impacted cerumen.  ?Eyes:  ?   General:     ?   Right eye: No discharge.     ?   Left eye: No discharge.  ?   Extraocular Movements: Extraocular movements intact.  ?   Conjunctiva/sclera: Conjunctivae  normal.  ?   Pupils: Pupils are equal, round, and reactive to light.  ?Neck:  ?   Thyroid: No thyroid mass or thyromegaly.  ?Cardiovascular:  ?   Rate and Rhythm: Normal rate and regular rhythm.  ?   Pulses: Normal pulses.  ?   Heart sounds: Normal heart sounds. No murmur heard. ?Pulmonary:  ?   Effort: Pulmonary effort is normal. No respiratory distress.  ?   Breath sounds: Normal breath sounds. No wheezing, rhonchi or rales.  ?Abdominal:  ?   General: Bowel sounds are normal. There is no distension.  ?   Palpations: Abdomen is soft. There is no mass.  ?   Tenderness: There is no abdominal tenderness. There is no guarding or rebound.  ?   Hernia: No hernia is present.  ?Musculoskeletal:  ?   Cervical back: Normal range of motion and neck supple. No rigidity.  ?   Right lower leg: No edema.  ?   Left lower leg: No edema.  ?Lymphadenopathy:  ?   Cervical: No cervical adenopathy.  ?Skin: ?   General: Skin is warm and dry.  ?   Findings: No rash.  ?Neurological:  ?   General: No focal deficit present.  ?   Mental Status: She is alert. Mental status is at baseline.  ?Psychiatric:     ?   Mood and Affect: Mood normal.     ?   Behavior: Behavior normal.  ? ?   ?Results for orders placed or performed in visit on 12/24/21  ?Basic metabolic panel  ?Result Value Ref Range  ? Sodium 138 135 - 145 mEq/L  ? Potassium 4.6 3.5 - 5.1 mEq/L  ? Chloride 104 96 - 112 mEq/L  ? CO2 27 19 - 32 mEq/L  ? Glucose, Bld 67 (L) 70 - 99 mg/dL  ? BUN 14 6 - 23 mg/dL  ? Creatinine, Ser 0.68 0.40 - 1.20 mg/dL  ? GFR 111.00 >60.00 mL/min  ? Calcium 9.4 8.4 - 10.5 mg/dL  ?TSH  ?Result Value Ref Range  ? TSH 2.55 0.35 - 5.50 uIU/mL  ? ? ?Assessment & Plan:  ?This visit occurred during the SARS-CoV-2 public health emergency.  Safety protocols were in place, including screening questions prior to the visit, additional usage of staff PPE, and extensive cleaning of exam room while observing appropriate contact time as indicated for disinfecting  solutions.  ? ?Problem List Items Addressed This Visit   ? ? Health care maintenance - Primary (Chronic)  ?  Preventative protocols reviewed and updated unless pt declined. ?Discussed healthy diet and lifestyle.  ?  ?  ? Hypothyroidism  ?  Stable period on current regimen (levothyroxine 163mg) dose adjusted recently by GYN after delivery ?  ?  ? Relevant Medications  ?  levothyroxine (SYNTHROID) 125 MCG tablet  ? H/O radioactive iodine thyroid ablation  ?  ? ?No orders of the defined types were placed in this encounter. ? ?No orders of the defined types were placed in this encounter. ? ? ? ?Patient instructions: ?You are doing well today ?Continue healthy diet and lifestyle.  ?Return as needed or in 1 year for next physical.  ? ?Follow up plan: ?Return in about 1 year (around 01/01/2023) for annual exam, prior fasting for blood work. ? ?Ria Bush, MD   ?

## 2021-12-31 NOTE — Patient Instructions (Signed)
You are doing well today ?Continue healthy diet and lifestyle.  ?Return as needed or in 1 year for next physical.  ? ?Health Maintenance, Female ?Adopting a healthy lifestyle and getting preventive care are important in promoting health and wellness. Ask your health care provider about: ?The right schedule for you to have regular tests and exams. ?Things you can do on your own to prevent diseases and keep yourself healthy. ?What should I know about diet, weight, and exercise? ?Eat a healthy diet ? ?Eat a diet that includes plenty of vegetables, fruits, low-fat dairy products, and lean protein. ?Do not eat a lot of foods that are high in solid fats, added sugars, or sodium. ?Maintain a healthy weight ?Body mass index (BMI) is used to identify weight problems. It estimates body fat based on height and weight. Your health care provider can help determine your BMI and help you achieve or maintain a healthy weight. ?Get regular exercise ?Get regular exercise. This is one of the most important things you can do for your health. Most adults should: ?Exercise for at least 150 minutes each week. The exercise should increase your heart rate and make you sweat (moderate-intensity exercise). ?Do strengthening exercises at least twice a week. This is in addition to the moderate-intensity exercise. ?Spend less time sitting. Even light physical activity can be beneficial. ?Watch cholesterol and blood lipids ?Have your blood tested for lipids and cholesterol at 38 years of age, then have this test every 5 years. ?Have your cholesterol levels checked more often if: ?Your lipid or cholesterol levels are high. ?You are older than 38 years of age. ?You are at high risk for heart disease. ?What should I know about cancer screening? ?Depending on your health history and family history, you may need to have cancer screening at various ages. This may include screening for: ?Breast cancer. ?Cervical cancer. ?Colorectal cancer. ?Skin  cancer. ?Lung cancer. ?What should I know about heart disease, diabetes, and high blood pressure? ?Blood pressure and heart disease ?High blood pressure causes heart disease and increases the risk of stroke. This is more likely to develop in people who have high blood pressure readings or are overweight. ?Have your blood pressure checked: ?Every 3-5 years if you are 55-70 years of age. ?Every year if you are 6 years old or older. ?Diabetes ?Have regular diabetes screenings. This checks your fasting blood sugar level. Have the screening done: ?Once every three years after age 43 if you are at a normal weight and have a low risk for diabetes. ?More often and at a younger age if you are overweight or have a high risk for diabetes. ?What should I know about preventing infection? ?Hepatitis B ?If you have a higher risk for hepatitis B, you should be screened for this virus. Talk with your health care provider to find out if you are at risk for hepatitis B infection. ?Hepatitis C ?Testing is recommended for: ?Everyone born from [birth year] through 1967. ?Anyone with known risk factors for hepatitis C. ?Sexually transmitted infections (STIs) ?Get screened for STIs, including gonorrhea and chlamydia, if: ?You are sexually active and are younger than 38 years of age. ?You are older than 38 years of age and your health care provider tells you that you are at risk for this type of infection. ?Your sexual activity has changed since you were last screened, and you are at increased risk for chlamydia or gonorrhea. Ask your health care provider if you are at risk. ?Ask your health care  provider about whether you are at high risk for HIV. Your health care provider may recommend a prescription medicine to help prevent HIV infection. If you choose to take medicine to prevent HIV, you should first get tested for HIV. You should then be tested every 3 months for as long as you are taking the medicine. ?Pregnancy ?If you are about to stop  having your period (premenopausal) and you may become pregnant, seek counseling before you get pregnant. ?Take 400 to 800 micrograms (mcg) of folic acid every day if you become pregnant. ?Ask for birth control (contraception) if you want to prevent pregnancy. ?Osteoporosis and menopause ?Osteoporosis is a disease in which the bones lose minerals and strength with aging. This can result in bone fractures. If you are 33 years old or older, or if you are at risk for osteoporosis and fractures, ask your health care provider if you should: ?Be screened for bone loss. ?Take a calcium or vitamin D supplement to lower your risk of fractures. ?Be given hormone replacement therapy (HRT) to treat symptoms of menopause. ?Follow these instructions at home: ?Alcohol use ?Do not drink alcohol if: ?Your health care provider tells you not to drink. ?You are pregnant, may be pregnant, or are planning to become pregnant. ?If you drink alcohol: ?Limit how much you have to: ?0-1 drink a day. ?Know how much alcohol is in your drink. In the [place] one drink equals one 12 oz bottle of beer (355 mL), one 5 oz glass of wine (148 mL), or one 1? oz glass of hard liquor (44 mL). ?Lifestyle ?Do not use any products that contain nicotine or tobacco. These products include cigarettes, chewing tobacco, and vaping devices, such as e-cigarettes. If you need help quitting, ask your health care provider. ?Do not use street drugs. ?Do not share needles. ?Ask your health care provider for help if you need support or information about quitting drugs. ?General instructions ?Schedule regular health, dental, and eye exams. ?Stay current with your vaccines. ?Tell your health care provider if: ?You often feel depressed. ?You have ever been abused or do not feel safe at home. ?Summary ?Adopting a healthy lifestyle and getting preventive care are important in promoting health and wellness. ?Follow your health care provider's instructions about healthy diet,  exercising, and getting tested or screened for diseases. ?Follow your health care provider's instructions on monitoring your cholesterol and blood pressure. ?This information is not intended to replace advice given to you by your health care provider. Make sure you discuss any questions you have with your health care provider. ?Document Revised: 02/25/2021 Document Reviewed: 02/25/2021 ?Elsevier Patient Education ? [place]. ? ?

## 2021-12-31 NOTE — Assessment & Plan Note (Signed)
Stable period on current regimen (levothyroxine 164mg) dose adjusted recently by GYN after delivery ?

## 2021-12-31 NOTE — Assessment & Plan Note (Signed)
Preventative protocols reviewed and updated unless pt declined. Discussed healthy diet and lifestyle.  

## 2022-01-23 ENCOUNTER — Telehealth: Payer: 59 | Admitting: Family Medicine

## 2022-01-23 DIAGNOSIS — R197 Diarrhea, unspecified: Secondary | ICD-10-CM

## 2022-01-23 NOTE — Progress Notes (Signed)
[facility]  ? ?Pt needs to be seen in person given a week of on going symptoms. ?Message sent via mychart. ? ?Of note pt is breastfeeding as well and OTC are not working. ? ?

## 2022-05-02 NOTE — Telephone Encounter (Signed)
error 

## 2022-05-24 ENCOUNTER — Telehealth: Payer: 59 | Admitting: Nurse Practitioner

## 2022-05-24 DIAGNOSIS — R399 Unspecified symptoms and signs involving the genitourinary system: Secondary | ICD-10-CM

## 2022-05-25 MED ORDER — NITROFURANTOIN MONOHYD MACRO 100 MG PO CAPS: 100.0000 mg | ORAL_CAPSULE | Freq: Two times a day (BID) | ORAL | 0 refills | Status: AC

## 2022-05-25 NOTE — Progress Notes (Signed)

## 2022-05-25 NOTE — Progress Notes (Signed)
I have spent 5 minutes in review of e-visit questionnaire, review and updating patient chart, medical decision making and response to patient.  ° °Armend Hochstatter W Shloka Baldridge, NP ° °  °

## 2022-08-20 ENCOUNTER — Encounter: Payer: Self-pay | Admitting: Family Medicine

## 2022-08-20 DIAGNOSIS — E039 Hypothyroidism, unspecified: Secondary | ICD-10-CM

## 2022-08-21 NOTE — Addendum Note (Signed)
Addended by: Ria Bush on: 08/21/2022 11:37 PM   Modules accepted: Orders

## 2022-10-29 ENCOUNTER — Other Ambulatory Visit (INDEPENDENT_AMBULATORY_CARE_PROVIDER_SITE_OTHER): Payer: 59

## 2022-10-29 DIAGNOSIS — E039 Hypothyroidism, unspecified: Secondary | ICD-10-CM | POA: Diagnosis not present

## 2022-10-29 LAB — TSH: TSH: 12.69 u[IU]/mL — ABNORMAL HIGH (ref 0.35–5.50)

## 2022-10-30 ENCOUNTER — Encounter: Payer: Self-pay | Admitting: Family Medicine

## 2022-10-30 DIAGNOSIS — E039 Hypothyroidism, unspecified: Secondary | ICD-10-CM

## 2022-10-30 MED ORDER — LEVOTHYROXINE SODIUM 150 MCG PO TABS: 150.0000 ug | ORAL_TABLET | Freq: Every day | ORAL | 3 refills | Status: DC

## 2022-11-17 ENCOUNTER — Other Ambulatory Visit: Payer: Self-pay | Admitting: Obstetrics and Gynecology

## 2022-11-17 ENCOUNTER — Telehealth: Payer: 59 | Admitting: Physician Assistant

## 2022-11-17 DIAGNOSIS — B9689 Other specified bacterial agents as the cause of diseases classified elsewhere: Secondary | ICD-10-CM

## 2022-11-17 DIAGNOSIS — J019 Acute sinusitis, unspecified: Secondary | ICD-10-CM | POA: Diagnosis not present

## 2022-11-17 DIAGNOSIS — Z1231 Encounter for screening mammogram for malignant neoplasm of breast: Secondary | ICD-10-CM

## 2022-11-17 MED ORDER — AMOXICILLIN-POT CLAVULANATE 875-125 MG PO TABS: 1.0000 | ORAL_TABLET | Freq: Two times a day (BID) | ORAL | 0 refills | Status: DC

## 2022-11-17 NOTE — Progress Notes (Signed)
I have spent 5 minutes in review of e-visit questionnaire, review and updating patient chart, medical decision making and response to patient.   Haedyn Ancrum Cody Johnpatrick Jenny, PA-C    

## 2022-11-17 NOTE — Progress Notes (Signed)

## 2022-11-19 LAB — HM PAP SMEAR: HPV, high-risk: NEGATIVE

## 2022-11-26 ENCOUNTER — Other Ambulatory Visit (INDEPENDENT_AMBULATORY_CARE_PROVIDER_SITE_OTHER): Payer: 59

## 2022-11-26 DIAGNOSIS — E039 Hypothyroidism, unspecified: Secondary | ICD-10-CM | POA: Diagnosis not present

## 2022-11-26 LAB — TSH: TSH: 4.99 u[IU]/mL (ref 0.35–5.50)

## 2022-11-28 ENCOUNTER — Other Ambulatory Visit: Payer: Self-pay | Admitting: Family Medicine

## 2022-12-02 MED ORDER — LEVOTHYROXINE SODIUM 175 MCG PO TABS: 175.0000 ug | ORAL_TABLET | Freq: Every day | ORAL | 3 refills | Status: DC

## 2022-12-02 NOTE — Addendum Note (Signed)
Addended by: Ria Bush on: 12/02/2022 08:10 AM   Modules accepted: Orders

## 2022-12-03 ENCOUNTER — Ambulatory Visit
Admission: RE | Admit: 2022-12-03 | Discharge: 2022-12-03 | Disposition: A | Discharge status: Home / Self Care | Payer: 59 | Source: Ambulatory Visit | Attending: Obstetrics and Gynecology | Admitting: Obstetrics and Gynecology

## 2022-12-03 DIAGNOSIS — Z1231 Encounter for screening mammogram for malignant neoplasm of breast: Secondary | ICD-10-CM | POA: Diagnosis present

## 2022-12-10 ENCOUNTER — Telehealth: Payer: 59 | Admitting: Nurse Practitioner

## 2022-12-10 DIAGNOSIS — J0141 Acute recurrent pansinusitis: Secondary | ICD-10-CM | POA: Diagnosis not present

## 2022-12-10 MED ORDER — DOXYCYCLINE HYCLATE 100 MG PO TABS: 100.0000 mg | ORAL_TABLET | Freq: Two times a day (BID) | ORAL | 0 refills | Status: AC

## 2022-12-10 NOTE — Progress Notes (Signed)
E-Visit for Sinus Problems  We are sorry that you are not feeling well.  Here is how we plan to help!  Based on what you have shared with me it looks like you have recurrent sinusitis.  Sinusitis is inflammation and infection in the sinus cavities of the head.  Based on your presentation I believe you most likely have Bacterial Sinusitis.  This is an infection caused by bacteria and is treated with antibiotics. I have prescribed Doxycycline 152m by mouth twice a day for 10 days. You may use an oral decongestant such as Mucinex D or if you have glaucoma or high blood pressure use plain Mucinex. Saline nasal spray help and can safely be used as often as needed for congestion.  If you develop worsening sinus pain, fever or notice severe headache and vision changes, or if symptoms are not better after completion of antibiotic, please schedule an appointment with a health care provider.    Sinus infections are not as easily transmitted as other respiratory infection, however we still recommend that you avoid close contact with loved ones, especially the very young and elderly.  Remember to wash your hands thoroughly throughout the day as this is the number one way to prevent the spread of infection!  Home Care: Only take medications as instructed by your medical team. Complete the entire course of an antibiotic. Do not take these medications with alcohol. A steam or ultrasonic humidifier can help congestion.  You can place a towel over your head and breathe in the steam from hot water coming from a faucet. Avoid close contacts especially the very young and the elderly. Cover your mouth when you cough or sneeze. Always remember to wash your hands.  Get Help Right Away If: You develop worsening fever or sinus pain. You develop a severe head ache or visual changes. Your symptoms persist after you have completed your treatment plan.  Make sure you Understand these instructions. Will watch your  condition. Will get help right away if you are not doing well or get worse.  Thank you for choosing an e-visit.  Your e-visit answers were reviewed by a board certified advanced clinical practitioner to complete your personal care plan. Depending upon the condition, your plan could have included both over the counter or prescription medications.  Please review your pharmacy choice. Make sure the pharmacy is open so you can pick up prescription now. If there is a problem, you may contact your provider through [employer] have the prescription routed to another pharmacy.  Your safety is important to [place] If you have drug allergies check your prescription carefully.   For the next 24 hours you can use MyChart to ask questions about today's visit, request a non-urgent call back, or ask for a work or school excuse. You will get an email in the next two days asking about your experience. I hope that your e-visit has been valuable and will speed your recovery.   Meds ordered this encounter  Medications   doxycycline (VIBRA-TABS) 100 MG tablet    Sig: Take 1 tablet (100 mg total) by mouth 2 (two) times daily for 10 days.    Dispense:  20 tablet    Refill:  0     I spent approximately 5 minutes reviewing the patient's history, current symptoms and coordinating their care today.

## 2023-01-02 ENCOUNTER — Telehealth: Payer: 59 | Admitting: Nurse Practitioner

## 2023-01-02 DIAGNOSIS — J324 Chronic pansinusitis: Secondary | ICD-10-CM

## 2023-01-02 NOTE — Progress Notes (Signed)
Jalexy,  Because your symptoms have become chronically recurrent, I feel your condition warrants further evaluation and I recommend that you be seen for a face to face visit.  Please contact your primary care physician practice to be seen. Many offices offer virtual options to be seen via video if you are not comfortable going in person to a medical facility at this time.  If you cannot get into your primary care before the weekend you may visit an Urgent Care if needed. It may be time to discuss with your doctor the need to see an [facility] doctor. Unfortunately we do not provide referrals at this time.   NOTE: You will NOT be charged for this eVisit.  If you do not have a PCP, [facility] offers a free physician referral service available at [phone]. Our trained staff has the experience, knowledge and resources to put you in touch with a physician who is right for you.    If you are having a true medical emergency please call 911.   Your e-visit answers were reviewed by a board certified advanced clinical practitioner to complete your personal care plan.  Thank you for using e-Visits.

## 2023-03-04 ENCOUNTER — Ambulatory Visit: Payer: 59 | Admitting: Family Medicine

## 2023-03-04 ENCOUNTER — Encounter: Payer: Self-pay | Admitting: Family Medicine

## 2023-03-04 VITALS — BP 116/76 | HR 73 | Temp 97.3°F | Ht 64.5 in | Wt 128.0 lb

## 2023-03-04 DIAGNOSIS — J3489 Other specified disorders of nose and nasal sinuses: Secondary | ICD-10-CM | POA: Insufficient documentation

## 2023-03-04 DIAGNOSIS — E039 Hypothyroidism, unspecified: Secondary | ICD-10-CM

## 2023-03-04 DIAGNOSIS — Z0000 Encounter for general adult medical examination without abnormal findings: Secondary | ICD-10-CM

## 2023-03-04 DIAGNOSIS — Z923 Personal history of irradiation: Secondary | ICD-10-CM

## 2023-03-04 DIAGNOSIS — Z131 Encounter for screening for diabetes mellitus: Secondary | ICD-10-CM | POA: Diagnosis not present

## 2023-03-04 LAB — BASIC METABOLIC PANEL
BUN: 10 mg/dL (ref 6–23)
CO2: 27 mEq/L (ref 19–32)
Calcium: 8.9 mg/dL (ref 8.4–10.5)
Chloride: 105 mEq/L (ref 96–112)
Creatinine, Ser: 0.56 mg/dL (ref 0.40–1.20)
GFR: 115.35 mL/min (ref >60.00)
Glucose, Bld: 68 mg/dL — ABNORMAL LOW (ref 70–99)
Potassium: 4.4 mEq/L (ref 3.5–5.1)
Sodium: 139 mEq/L (ref 135–145)

## 2023-03-04 LAB — TSH: TSH: 0.21 u[IU]/mL — ABNORMAL LOW (ref 0.35–5.50)

## 2023-03-04 NOTE — Assessment & Plan Note (Signed)
Preventative protocols reviewed and updated unless pt declined. Discussed healthy diet and lifestyle.  

## 2023-03-04 NOTE — Patient Instructions (Addendum)
Labs today  We will refer you to [facility] for evaluation.  You are doing well today Return as needed or in 1 year for next physical

## 2023-03-04 NOTE — Assessment & Plan Note (Signed)
Chronic, stable on levothyroxine daily - update TSH.
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
## 2023-03-05 ENCOUNTER — Other Ambulatory Visit: Payer: Self-pay | Admitting: Family Medicine

## 2023-03-05 DIAGNOSIS — E039 Hypothyroidism, unspecified: Secondary | ICD-10-CM

## 2023-03-05 MED ORDER — LEVOTHYROXINE SODIUM 175 MCG PO TABS: 175.0000 ug | ORAL_TABLET | Freq: Every day | ORAL | 3 refills | Status: DC

## 2023-06-18 ENCOUNTER — Other Ambulatory Visit: Payer: Self-pay | Admitting: Obstetrics and Gynecology

## 2023-06-18 DIAGNOSIS — Z803 Family history of malignant neoplasm of breast: Secondary | ICD-10-CM

## 2023-06-18 DIAGNOSIS — Z1239 Encounter for other screening for malignant neoplasm of breast: Secondary | ICD-10-CM

## 2023-06-29 ENCOUNTER — Ambulatory Visit: Payer: 59

## 2023-07-06 ENCOUNTER — Ambulatory Visit: Payer: 59

## 2023-07-08 ENCOUNTER — Encounter: Payer: Self-pay | Admitting: Otolaryngology

## 2023-07-10 NOTE — Discharge Instructions (Signed)
[facility] ENDOSCOPIC SINUS SURGERY [facility]  What is Functional Endoscopic Sinus Surgery?  The Surgery involves making the natural openings of the sinuses larger by removing the bony partitions that separate the sinuses from the nasal cavity.  The natural sinus lining is preserved as much as possible to allow the sinuses to resume normal function after the surgery.  In some patients nasal polyps (excessively swollen lining of the sinuses) may be removed to relieve obstruction of the sinus openings.  The surgery is performed through the nose using lighted scopes, which eliminates the need for incisions on the face.  A septoplasty is a different procedure which is sometimes performed with sinus surgery.  It involves straightening the boy partition that separates the two sides of your nose.  A crooked or deviated septum may need repair if is obstructing the sinuses or nasal airflow.  Turbinate reduction is also often performed during sinus surgery.  The turbinates are bony proturberances from the side walls of the nose which swell and can obstruct the nose in patients with sinus and allergy problems.  Their size can be surgically reduced to help relieve nasal obstruction.  What Can Sinus Surgery Do For Me?  Sinus surgery can reduce the frequency of sinus infections requiring antibiotic treatment.  This can provide improvement in nasal congestion, post-nasal drainage, facial pressure and nasal obstruction.  Surgery will NOT prevent you from ever having an infection again, so it usually only for patients who get infections 4 or more times yearly requiring antibiotics, or for infections that do not clear with antibiotics.  It will not cure nasal allergies, so patients with allergies may still require medication to treat their allergies after surgery. Surgery may improve headaches related to sinusitis, however, some people will continue to  require medication to control sinus headaches related to allergies.  Surgery will do nothing for other forms of headache (migraine, tension or cluster).  What Are the Risks of Endoscopic Sinus Surgery?  Current techniques allow surgery to be performed safely with little risk, however, there are rare complications that patients should be aware of.  Because the sinuses are located around the eyes, there is risk of eye injury, including blindness, though again, this would be quite rare. This is usually a result of bleeding behind the eye during surgery, which can effect vision, though there are treatments to protect the vision and prevent permanent injury. More serious complications would include bleeding inside the brain cavity or damage to the brain.This happens when the fluid around the brain leaks out into the sinus cavity.  Again, all of these complications are uncommon, and spinal fluid leaks can be safely managed surgically if they occur.  The most common complication of sinus surgery is bleeding from the nose, which may require packing or cauterization of the nose.  Patients with polyps may experience recurrence of the polyps that would require revision surgery.  Alterations of sense of smell or injury to the tear ducts are also rare complications.   What is the Surgery Like, and what is the Recovery?  The Surgery usually takes a couple of hours to perform, and is usually performed under a general anesthetic (completely asleep).  Patients are usually discharged home after a couple of hours.  Sometimes during surgery it is necessary to pack the nose to control bleeding, and the packing is left in place for 24 - 48 hours, and removed by your surgeon.  If   a septoplasty was performed during the procedure, there is often a splint placed which must be removed after 5-7 days.   Discomfort: Pain is usually mild to moderate, and can be controlled by prescription pain medication or acetaminophen (Tylenol).   Aspirin, Ibuprofen (Advil, Motrin), or Naprosyn (Aleve) should be avoided, as they can cause increased bleeding.  Most patients feel sinus pressure like they have a bad head cold for several days.  Sleeping with your head elevated can help reduce swelling and facial pressure, as can ice packs over the face.  A humidifier may be helpful to keep the mucous and blood from drying in the nose.   Diet: There are no specific diet restrictions, however, you should generally start with clear liquids and a light diet of bland foods because the anesthetic can cause some nausea.  Advance your diet depending on how your stomach feels.  Taking your pain medication with food will often help reduce stomach upset which pain medications can cause.  Nasal Saline Irrigation: It is important to remove blood clots and dried mucous from the nose as it is healing.  This is done by having you irrigate the nose at least 3 - 4 times daily with a salt water solution.  We recommend using NeilMed Sinus Rinse (available at the drug store).  Fill the squeeze bottle with the solution, bend over a sink, and insert the tip of the squeeze bottle into the nose  of an inch.  Point the tip of the squeeze bottle towards the inside corner of the eye on the same side your irrigating.  Squeeze the bottle and gently irrigate the nose.  If you bend forward as you do this, most of the fluid will flow back out of the nose, instead of down your throat.   The solution should be warm, near body temperature, when you irrigate.   Each time you irrigate, you should use a full squeeze bottle.   Note that if you are instructed to use Nasal Steroid Sprays at any time after your surgery, irrigate with saline BEFORE using the steroid spray, so you do not wash it all out of the nose. Another product, Nasal Saline Gel (such as AYR Nasal Saline Gel) can be applied in each nostril 3 - 4 times daily to moisture the nose and reduce scabbing or crusting.  Bleeding:   Bloody drainage from the nose can be expected for several days, and patients are instructed to irrigate their nose frequently with salt water to help remove mucous and blood clots.  The drainage may be dark red or brown, though some fresh blood may be seen intermittently, especially after irrigation.  Do not blow you nose, as bleeding may occur. If you must sneeze, keep your mouth open to allow air to escape through your mouth.  If heavy bleeding occurs: Irrigate the nose with saline to rinse out clots, then spray the nose 3 - 4 times with Afrin Nasal Decongestant Spray.  The spray will constrict the blood vessels to slow bleeding.  Pinch the lower half of your nose shut to apply pressure, and lay down with your head elevated.  Ice packs over the nose may help as well. If bleeding persists despite these measures, you should notify your doctor.  Do not use the Afrin routinely to control nasal congestion after surgery, as it can result in worsening congestion and may affect healing.     Activity: Return to work varies among patients. Most patients will be out   of work at least 5 - 7 days to recover.  Patient may return to work after they are off of narcotic pain medication, and feeling well enough to perform the functions of their job.  Patients must avoid heavy lifting (over 10 pounds) or strenuous physical for 2 weeks after surgery, so your employer may need to assign you to light duty, or keep you out of work longer if light duty is not possible.  NOTE: you should not drive, operate dangerous machinery, do any mentally demanding tasks or make any important legal or financial decisions while on narcotic pain medication and recovering from the general anesthetic.    Call Your Doctor Immediately if You Have Any of the Following: Bleeding that you cannot control with the above measures Loss of vision, double vision, bulging of the eye or black eyes. Fever over 101 degrees Neck stiffness with severe headache,  fever, nausea and change in mental state. You are always encouraged to call anytime with concerns, however, please call with requests for pain medication refills during office hours.  Office Endoscopy: During follow-up visits your doctor will remove any packing or splints that may have been placed and evaluate and clean your sinuses endoscopically.  Topical anesthetic will be used to make this as comfortable as possible, though you may want to take your pain medication prior to the visit.  How often this will need to be done varies from patient to patient.  After complete recovery from the surgery, you may need follow-up endoscopy from time to time, particularly if there is concern of recurrent infection or nasal polyps.  

## 2023-07-15 ENCOUNTER — Ambulatory Visit: Payer: 59 | Admitting: Anesthesiology

## 2023-07-15 ENCOUNTER — Other Ambulatory Visit: Payer: Self-pay

## 2023-07-15 ENCOUNTER — Ambulatory Visit
Admission: RE | Admit: 2023-07-15 | Discharge: 2023-07-15 | Disposition: A | Discharge status: Home / Self Care | Payer: 59 | Attending: Otolaryngology | Admitting: Otolaryngology

## 2023-07-15 ENCOUNTER — Encounter
Admission: RE | Disposition: A | Discharge status: Home / Self Care | Payer: Self-pay | Source: Home / Self Care | Attending: Otolaryngology

## 2023-07-15 ENCOUNTER — Encounter: Payer: Self-pay | Admitting: Otolaryngology

## 2023-07-15 DIAGNOSIS — M1990 Unspecified osteoarthritis, unspecified site: Secondary | ICD-10-CM | POA: Diagnosis not present

## 2023-07-15 DIAGNOSIS — J343 Hypertrophy of nasal turbinates: Secondary | ICD-10-CM | POA: Diagnosis not present

## 2023-07-15 DIAGNOSIS — E039 Hypothyroidism, unspecified: Secondary | ICD-10-CM | POA: Insufficient documentation

## 2023-07-15 DIAGNOSIS — J342 Deviated nasal septum: Secondary | ICD-10-CM | POA: Diagnosis present

## 2023-07-15 HISTORY — DX: Chronic pansinusitis: J32.4

## 2023-07-15 HISTORY — PX: TURBINATE REDUCTION: SHX6157

## 2023-07-15 HISTORY — DX: Unspecified osteoarthritis, unspecified site: M19.90

## 2023-07-15 HISTORY — PX: SEPTOPLASTY: SHX2393

## 2023-07-15 LAB — POCT PREGNANCY, URINE: Preg Test, Ur: NEGATIVE

## 2023-07-15 SURGERY — SEPTOPLASTY, NOSE
Anesthesia: General | Site: Nose | Laterality: Bilateral

## 2023-07-15 MED ORDER — LACTATED RINGERS IV SOLN: INTRAVENOUS | Status: DC

## 2023-07-15 MED ORDER — SUCCINYLCHOLINE CHLORIDE 200 MG/10ML IV SOSY
PREFILLED_SYRINGE | INTRAVENOUS | Status: AC
  Filled 2023-07-15: qty 10

## 2023-07-15 MED ORDER — FENTANYL CITRATE (PF) 100 MCG/2ML IJ SOLN
INTRAMUSCULAR | Status: DC | PRN
  Administered 2023-07-15: 100 ug via INTRAVENOUS

## 2023-07-15 MED ORDER — OXYMETAZOLINE HCL 0.05 % NA SOLN
NASAL | Status: DC | PRN
  Administered 2023-07-15: 1 via TOPICAL

## 2023-07-15 MED ORDER — MIDAZOLAM HCL 5 MG/5ML IJ SOLN
INTRAMUSCULAR | Status: DC | PRN
  Administered 2023-07-15: 2 mg via INTRAVENOUS

## 2023-07-15 MED ORDER — FENTANYL CITRATE (PF) 100 MCG/2ML IJ SOLN
INTRAMUSCULAR | Status: AC
  Filled 2023-07-15: qty 2

## 2023-07-15 MED ORDER — CEPHALEXIN 500 MG PO CAPS: 500.0000 mg | ORAL_CAPSULE | Freq: Three times a day (TID) | ORAL | 0 refills | Status: DC

## 2023-07-15 MED ORDER — ACETAMINOPHEN 10 MG/ML IV SOLN: 1000.0000 mg | Freq: Once | INTRAVENOUS | Status: DC

## 2023-07-15 MED ORDER — MIDAZOLAM HCL 2 MG/2ML IJ SOLN
INTRAMUSCULAR | Status: AC
  Filled 2023-07-15: qty 2

## 2023-07-15 MED ORDER — ONDANSETRON HCL 4 MG/2ML IJ SOLN
INTRAMUSCULAR | Status: DC | PRN
  Administered 2023-07-15: 4 mg via INTRAVENOUS

## 2023-07-15 MED ORDER — DEXAMETHASONE SODIUM PHOSPHATE 4 MG/ML IJ SOLN
INTRAMUSCULAR | Status: AC
  Filled 2023-07-15: qty 2

## 2023-07-15 MED ORDER — ONDANSETRON HCL 4 MG PO TABS: 4.0000 mg | ORAL_TABLET | Freq: Three times a day (TID) | ORAL | 0 refills | Status: DC | PRN

## 2023-07-15 MED ORDER — LIDOCAINE HCL (PF) 2 % IJ SOLN
INTRAMUSCULAR | Status: AC
  Filled 2023-07-15: qty 5

## 2023-07-15 MED ORDER — ACETAMINOPHEN 10 MG/ML IV SOLN
INTRAVENOUS | Status: AC
  Filled 2023-07-15: qty 100

## 2023-07-15 MED ORDER — EPHEDRINE SULFATE (PRESSORS) 50 MG/ML IJ SOLN
INTRAMUSCULAR | Status: DC | PRN
Start: 2023-07-15 — End: 2023-07-15
  Administered 2023-07-15 (×2): 7.5 mg via INTRAVENOUS

## 2023-07-15 MED ORDER — DEXAMETHASONE SODIUM PHOSPHATE 4 MG/ML IJ SOLN
INTRAMUSCULAR | Status: DC | PRN
  Administered 2023-07-15: 8 mg via INTRAVENOUS

## 2023-07-15 MED ORDER — ACETAMINOPHEN 10 MG/ML IV SOLN
1000.0000 mg | Freq: Once | INTRAVENOUS | Status: AC
  Administered 2023-07-15: 1000 mg via INTRAVENOUS

## 2023-07-15 MED ORDER — TRIPLE ANTIBIOTIC 3.5-400-5000 EX OINT
TOPICAL_OINTMENT | CUTANEOUS | Status: DC | PRN
  Administered 2023-07-15: 1 via TOPICAL

## 2023-07-15 MED ORDER — LIDOCAINE HCL (CARDIAC) PF 100 MG/5ML IV SOSY
PREFILLED_SYRINGE | INTRAVENOUS | Status: DC | PRN
  Administered 2023-07-15: 100 mg via INTRAVENOUS

## 2023-07-15 MED ORDER — EPHEDRINE 5 MG/ML INJ
INTRAVENOUS | Status: AC
  Filled 2023-07-15: qty 5

## 2023-07-15 MED ORDER — LIDOCAINE-EPINEPHRINE 1 %-1:100000 IJ SOLN
INTRAMUSCULAR | Status: DC | PRN
  Administered 2023-07-15: 7 mL

## 2023-07-15 MED ORDER — SUCCINYLCHOLINE CHLORIDE 200 MG/10ML IV SOSY
PREFILLED_SYRINGE | INTRAVENOUS | Status: DC | PRN
  Administered 2023-07-15: 140 mg via INTRAVENOUS

## 2023-07-15 MED ORDER — OXYCODONE HCL 5 MG/5ML PO SOLN
ORAL | Status: AC
  Filled 2023-07-15: qty 10

## 2023-07-15 MED ORDER — SEVOFLURANE IN SOLN
RESPIRATORY_TRACT | Status: AC
  Filled 2023-07-15: qty 250

## 2023-07-15 MED ORDER — PROPOFOL 10 MG/ML IV BOLUS
INTRAVENOUS | Status: DC | PRN
Start: 2023-07-15 — End: 2023-07-15
  Administered 2023-07-15: 150 mg via INTRAVENOUS

## 2023-07-15 MED ORDER — ONDANSETRON HCL 4 MG/2ML IJ SOLN
INTRAMUSCULAR | Status: AC
  Filled 2023-07-15: qty 2

## 2023-07-15 MED ORDER — DEXMEDETOMIDINE HCL IN NACL 80 MCG/20ML IV SOLN
INTRAVENOUS | Status: DC | PRN
Start: 2023-07-15 — End: 2023-07-15
  Administered 2023-07-15: 10 ug via INTRAVENOUS

## 2023-07-15 MED ORDER — OXYCODONE-ACETAMINOPHEN 5-325 MG PO TABS
1.0000 | ORAL_TABLET | ORAL | 0 refills | Status: DC | PRN
Start: 2023-07-15 — End: 2024-06-13

## 2023-07-15 MED ORDER — OXYCODONE HCL 5 MG/5ML PO SOLN
10.0000 mg | Freq: Once | ORAL | Status: AC
  Administered 2023-07-15: 10 mg via ORAL

## 2023-07-15 SURGICAL SUPPLY — 24 items
ANTIFOG SOL W/FOAM PAD STRL (MISCELLANEOUS) ×1
BLADE ELECT COATED/INSUL 125 (ELECTRODE) ×1 IMPLANT
CANISTER SUCT 1200ML W/VALVE (MISCELLANEOUS) ×1 IMPLANT
COAG SUCTION FOOTSWITCH 10FR (SUCTIONS) ×2 IMPLANT
DRESSING NASL FOAM PST OP SINU (MISCELLANEOUS) IMPLANT
DRSG NASAL FOAM POST OP SINU (MISCELLANEOUS) ×1
ELECT REM PT RETURN 9FT ADLT (ELECTROSURGICAL) ×1
ELECTRODE REM PT RTRN 9FT ADLT (ELECTROSURGICAL) ×1 IMPLANT
GLOVE SURG GAMMEX PI TX LF 7.5 (GLOVE) ×2 IMPLANT
GOWN STRL REUS W/ TWL LRG LVL3 (GOWN DISPOSABLE) ×1 IMPLANT
GOWN STRL REUS W/TWL LRG LVL3 (GOWN DISPOSABLE) ×1
KIT TURNOVER KIT A (KITS) ×1 IMPLANT
NDL HYPO 25GX1X1/2 BEV (NEEDLE) ×1 IMPLANT
NEEDLE HYPO 25GX1X1/2 BEV (NEEDLE) ×1
NS IRRIG 500ML POUR BTL (IV SOLUTION) ×1 IMPLANT
PACK ENT CUSTOM (PACKS) ×1 IMPLANT
PATTIES SURGICAL .5 X3 (DISPOSABLE) ×1 IMPLANT
SOLUTION ANTFG W/FOAM PAD STRL (MISCELLANEOUS) ×1 IMPLANT
SPLINT NASAL SEPTAL BLV .50 ST (MISCELLANEOUS) ×1 IMPLANT
SUT CHROMIC 4 0 RB 1X27 (SUTURE) ×1 IMPLANT
SUT ETHILON 3-0 FS-10 30 BLK (SUTURE) ×2
SUTURE EHLN 3-0 FS-10 30 BLK (SUTURE) ×1 IMPLANT
SYR 10ML LL (SYRINGE) ×1 IMPLANT
TOWEL OR 17X26 4PK STRL BLUE (TOWEL DISPOSABLE) ×1 IMPLANT

## 2023-07-15 NOTE — Anesthesia Preprocedure Evaluation (Addendum)
Anesthesia Evaluation  Patient identified by MRN, date of birth, ID band Patient awake    Reviewed: Allergy & Precautions, H&P , NPO status , Patient's Chart, lab work & pertinent test results  Airway Mallampati: III  TM Distance: >3 FB Neck ROM: Full    Dental no notable dental hx.    Pulmonary neg pulmonary ROS   Pulmonary exam normal breath sounds clear to auscultation       Cardiovascular negative cardio ROS Normal cardiovascular exam Rhythm:Regular Rate:Normal     Neuro/Psych negative neurological ROS  negative psych ROS   GI/Hepatic negative GI ROS, Neg liver ROS,,,  Endo/Other  negative endocrine ROSHypothyroidism    Renal/GU negative Renal ROS  negative genitourinary   Musculoskeletal negative musculoskeletal ROS (+) Arthritis ,    Abdominal   Peds negative pediatric ROS (+)  Hematology negative hematology ROS (+)   Anesthesia Other Findings Hypothyroidism  H/O radioactive iodine thyroid ablation Chronic pansinusitis  Osteoarthritis    Reproductive/Obstetrics negative OB ROS                             Anesthesia Physical Anesthesia Plan  ASA: 2  Anesthesia Plan: General ETT   Post-op Pain Management:    Induction: Intravenous  PONV Risk Score and Plan:   Airway Management Planned: Oral ETT  Additional Equipment:   Intra-op Plan:   Post-operative Plan: Extubation in OR  Informed Consent: I have reviewed the patients History and Physical, chart, labs and discussed the procedure including the risks, benefits and alternatives for the proposed anesthesia with the patient or authorized representative who has indicated his/her understanding and acceptance.     Dental Advisory Given  Plan Discussed with: Anesthesiologist, CRNA and Surgeon  Anesthesia Plan Comments: (Patient consented for risks of anesthesia including but not limited to:  - adverse reactions to  medications - damage to eyes, teeth, lips or other oral mucosa - nerve damage due to positioning  - sore throat or hoarseness - Damage to heart, brain, nerves, lungs, other parts of body or loss of life  Patient voiced understanding.)        Anesthesia Quick Evaluation

## 2023-07-15 NOTE — Anesthesia Procedure Notes (Signed)
Procedure Name: Intubation Date/Time: 07/15/2023 9:44 AM  Performed by: Germani Gavilanes, [place], CRNAPre-anesthesia Checklist: Patient identified, Emergency Drugs available, Suction available and Patient being monitored Patient Re-evaluated:Patient Re-evaluated prior to induction Oxygen Delivery Method: Circle system utilized Preoxygenation: Pre-oxygenation with 100% oxygen Induction Type: IV induction Ventilation: Mask ventilation without difficulty Laryngoscope Size: Mac and 3 Grade View: Grade II Tube type: Oral Rae Tube size: 7.0 mm Number of attempts: 1 Airway Equipment and Method: Stylet and Oral airway Placement Confirmation: ETT inserted through vocal cords under direct vision, positive ETCO2 and breath sounds checked- equal and bilateral Secured at: 22 cm Tube secured with: Tape Dental Injury: Teeth and Oropharynx as per pre-operative assessment

## 2023-07-15 NOTE — Transfer of Care (Signed)
Immediate Anesthesia Transfer of Care Note  Patient: Sheena Harris  Procedure(s) Performed: SEPTOPLASTY REVISION (Bilateral: Nose) INFERIOR TURBINATE REDUCTION (Bilateral: Nose)  Patient Location: PACU  Anesthesia Type: General ETT  Level of Consciousness: awake, alert  and patient cooperative  Airway and Oxygen Therapy: Patient Spontanous Breathing and Patient connected to supplemental oxygen  Post-op Assessment: Post-op Vital signs reviewed, Patient's Cardiovascular Status Stable, Respiratory Function Stable, Patent Airway and No signs of Nausea or vomiting  Post-op Vital Signs: Reviewed and stable  Complications: No notable events documented.

## 2023-07-15 NOTE — Op Note (Signed)
..07/15/2023  10:58 AM    Sheena Harris  [id number]    Pre-Op Dx:  Deviated Nasal Septum, Hypertrophic Inferior Turbinates  Post-op Dx: Same  Proc:   1)  Revision Nasal Septoplasty 2)  Revision  Bilateral Partial Reduction Inferior Turbinates   Surg:  Moxon Messler  Anes:  GOT  EBL:  40ml  Comp:  none  Findings: Previous septoplasty and previous partial resection of turbinates.  Right sided cartilagenous and bone deviation that was reduced.  Significant previous cartilage and bone resection more posteriorly.  Revision right partial reduction and lateralization of turbinates.  Procedure: With the patient in a comfortable supine position,  general orotracheal anesthesia was induced without difficulty.  The patient received preoperative Afrin spray for topical decongestion and vasoconstriction.  At an appropriate level, the patient was placed in a semi-sitting position.  Nasal vibrissae were trimmed.   1% Xylocaine with 1:100,000 epinephrine, 7 cc's, was infiltrated into the anterior floor of the nose, into the nasal spine region, into the membranous columella, and finally into the submucoperichondrial plane of the septum on both sides.  Several minutes were allowed for this to take effect.  Cottoniod pledgetts soaked in Afrin were placed into both nasal cavities and left while the patient was prepped and draped in the standard fashion.   A proper time-out was performed.  The materials were removed from the nose and observed to be intact and correct in number.  The nose was inspected with a headlight and zero degree endoscope with the findings as described above.  A left Killian incision was sharply executed and carried down to the caudal edge of the quadrangular cartilage with a 15 blade scapel.  A mucoperichondrial flap was elelvated along the quadrangular plate back to the bony-cartilaginous junction using caudal elevator and freer elevator.  This demonstrated scar tissue and  previous cartilagenous and bone resection from previous septoplasty.  Careful division of the mucosa was done in areas of previous resection.  The mucoperiostium was then elevated along the ethmoid plate and the vomer. An itracartilagenous incision was made using the freer elevator and a contralateral mucoperichondiral flap was elevated using a freer elevator in the anterior portion of the septum when the right sided deviation was present.  Care was taken to avoid any large rents or opposing rents in the mucoperichondrial flap.  Boney spurs of the vomer and maxillary crest were removed with Takahashi forceps.  The area of cartilagenous deviation was removed with combination of freer elevator and Takahashi forceps creating a widely patent nasal cavity as well as resolution of obstruction from the cartilagenous deviation. The mucosal flaps were placed back into their anatomic position to allow visualization of the airways. The septum now sat in the midline with an improved airway.  A 4-0 Chromic was used to close the Aucilla incision as well.   The inferior turbinates were then inspected.  Under endoscopic visualization, the inferior turbinates were infractured bilaterally with a Therapist, nutritional.  A kelly clamp was attached to the anterior-inferior third of each inferior turbinate for approximately one minute.  Under endoscopic visualization, Tru-cutting forceps were used to remove the anterior-inferior third of each inferior turbinate.  Electrocautery was used to control bleeding in the area. The remaining turbinate was then outfractured to open up the airway further. There was no significant bleeding noted. The right turbinate was then trimmed and outfractured in a similar fashion.  The airways were then visualized and showed open passageways on both sides that were significantly  improved compared to before surgery.       There was no signifcant bleeding. Nasal splints were applied to both sides of the  septum using Xomed 0.55mm regular sized splints that were trimmed, and then held in position with a 3-0 Nylon through and through suture.  Stamberger sinufoam was placed along the cut edge of the inferior turbinates bilaterally.  The patient was turned back over to anesthesia, and awakened, extubated, and taken to the PACU in satisfactory condition.  Dispo:   PACU to home  Plan: Ice, elevation, narcotic analgesia, steroid taper, and prophylactic antibiotics for the duration of indwelling nasal foreign bodies.  We will reevaluate the patient in the office in 6 days and remove the septal splints.  Return to work in 10 days, strenuous activities in two weeks.   Sheena Harris 07/15/2023 10:58 AM

## 2023-07-15 NOTE — Anesthesia Postprocedure Evaluation (Signed)
Anesthesia Post Note  Patient: Sheena Harris  Procedure(s) Performed: SEPTOPLASTY REVISION (Bilateral: Nose) INFERIOR TURBINATE REDUCTION (Bilateral: Nose)  Patient location during evaluation: PACU Anesthesia Type: General Level of consciousness: awake and alert Pain management: pain level controlled Vital Signs Assessment: post-procedure vital signs reviewed and stable Respiratory status: spontaneous breathing, nonlabored ventilation, respiratory function stable and patient connected to nasal cannula oxygen Cardiovascular status: blood pressure returned to baseline and stable Postop Assessment: no apparent nausea or vomiting Anesthetic complications: no   No notable events documented.   Last Vitals:  Vitals:   07/15/23 1140 07/15/23 1200  BP:  117/81  Pulse: 80 65  Resp: 14 (!) 6  Temp:    SpO2: 100% 97%    Last Pain:  Vitals:   07/15/23 1200  TempSrc:   PainSc: 0-No pain                 Ami Thornsberry C Delano Scardino

## 2023-07-15 NOTE — H&P (Signed)
..  History and Physical paper copy reviewed and updated date of procedure and will be scanned into system.  Patient seen and examined.  

## 2023-07-16 ENCOUNTER — Encounter: Payer: Self-pay | Admitting: Otolaryngology

## 2023-08-06 ENCOUNTER — Ambulatory Visit
Admission: RE | Admit: 2023-08-06 | Discharge: 2023-08-06 | Disposition: A | Discharge status: Home / Self Care | Payer: 59 | Source: Ambulatory Visit | Attending: Obstetrics and Gynecology | Admitting: Obstetrics and Gynecology

## 2023-08-06 ENCOUNTER — Ambulatory Visit: Admission: RE | Admit: 2023-08-06 | Payer: 59 | Source: Ambulatory Visit

## 2023-08-06 DIAGNOSIS — Z1239 Encounter for other screening for malignant neoplasm of breast: Secondary | ICD-10-CM | POA: Insufficient documentation

## 2023-08-06 DIAGNOSIS — Z803 Family history of malignant neoplasm of breast: Secondary | ICD-10-CM | POA: Diagnosis present

## 2023-08-06 MED ORDER — GADOBUTROL 1 MMOL/ML IV SOLN
5.0000 mL | Freq: Once | INTRAVENOUS | Status: AC | PRN
  Administered 2023-08-06: 5 mL via INTRAVENOUS

## 2023-08-13 ENCOUNTER — Ambulatory Visit: Admission: RE | Admit: 2023-08-13 | Payer: 59 | Source: Ambulatory Visit

## 2023-12-11 ENCOUNTER — Telehealth: Payer: 59 | Admitting: Emergency Medicine

## 2023-12-11 DIAGNOSIS — B9689 Other specified bacterial agents as the cause of diseases classified elsewhere: Secondary | ICD-10-CM

## 2023-12-11 DIAGNOSIS — J019 Acute sinusitis, unspecified: Secondary | ICD-10-CM

## 2023-12-11 MED ORDER — AMOXICILLIN-POT CLAVULANATE 875-125 MG PO TABS: 1.0000 | ORAL_TABLET | Freq: Two times a day (BID) | ORAL | 0 refills | Status: DC

## 2023-12-11 NOTE — Progress Notes (Signed)
E-Visit for Sinus Problems  We are sorry that you are not feeling well.  Here is how we plan to help!  Based on what you have shared with me it looks like you have sinusitis.  Sinusitis is inflammation and infection in the sinus cavities of the head.  Based on your presentation I believe you most likely have Acute Bacterial Sinusitis.  This is an infection caused by bacteria and is treated with antibiotics. I have prescribed Augmentin 875mg/125mg one tablet twice daily with food, for 7 days. You may use an oral decongestant such as Mucinex D or if you have glaucoma or high blood pressure use plain Mucinex. Saline nasal spray help and can safely be used as often as needed for congestion.  If you develop worsening sinus pain, fever or notice severe headache and vision changes, or if symptoms are not better after completion of antibiotic, please schedule an appointment with a health care provider.    Sinus infections are not as easily transmitted as other respiratory infection, however we still recommend that you avoid close contact with loved ones, especially the very young and elderly.  Remember to wash your hands thoroughly throughout the day as this is the number one way to prevent the spread of infection!  Home Care: Only take medications as instructed by your medical team. Complete the entire course of an antibiotic. Do not take these medications with alcohol. A steam or ultrasonic humidifier can help congestion.  You can place a towel over your head and breathe in the steam from hot water coming from a faucet. Avoid close contacts especially the very young and the elderly. Cover your mouth when you cough or sneeze. Always remember to wash your hands.  Get Help Right Away If: You develop worsening fever or sinus pain. You develop a severe head ache or visual changes. Your symptoms persist after you have completed your treatment plan.  Make sure you Understand these instructions. Will watch  your condition. Will get help right away if you are not doing well or get worse.  Thank you for choosing an e-visit.  Your e-visit answers were reviewed by a board certified advanced clinical practitioner to complete your personal care plan. Depending upon the condition, your plan could have included both over the counter or prescription medications.  Please review your pharmacy choice. Make sure the pharmacy is open so you can pick up prescription now. If there is a problem, you may contact your provider through MyChart messaging and have the prescription routed to another pharmacy.  Your safety is important to us. If you have drug allergies check your prescription carefully.   For the next 24 hours you can use MyChart to ask questions about today's visit, request a non-urgent call back, or ask for a work or school excuse. You will get an email in the next two days asking about your experience. I hope that your e-visit has been valuable and will speed your recovery.  Approximately 5 minutes was used in reviewing the patient's chart, questionnaire, prescribing medications, and documentation.  

## 2024-01-08 ENCOUNTER — Other Ambulatory Visit: Payer: Self-pay | Admitting: Obstetrics and Gynecology

## 2024-01-08 DIAGNOSIS — Z1231 Encounter for screening mammogram for malignant neoplasm of breast: Secondary | ICD-10-CM

## 2024-03-08 ENCOUNTER — Other Ambulatory Visit: Payer: Self-pay | Admitting: Obstetrics and Gynecology

## 2024-03-08 DIAGNOSIS — R928 Other abnormal and inconclusive findings on diagnostic imaging of breast: Secondary | ICD-10-CM

## 2024-03-15 ENCOUNTER — Ambulatory Visit
Admission: RE | Admit: 2024-03-15 | Discharge: 2024-03-15 | Disposition: A | Discharge status: Home / Self Care | Source: Ambulatory Visit | Attending: Obstetrics and Gynecology | Admitting: Obstetrics and Gynecology

## 2024-03-15 DIAGNOSIS — R928 Other abnormal and inconclusive findings on diagnostic imaging of breast: Secondary | ICD-10-CM | POA: Diagnosis present

## 2024-03-15 MED ORDER — GADOBUTROL 1 MMOL/ML IV SOLN
6.0000 mL | Freq: Once | INTRAVENOUS | Status: AC | PRN
  Administered 2024-03-15: 6 mL via INTRAVENOUS

## 2024-03-26 ENCOUNTER — Other Ambulatory Visit: Payer: Self-pay | Admitting: Family Medicine

## 2024-03-26 DIAGNOSIS — E039 Hypothyroidism, unspecified: Secondary | ICD-10-CM

## 2024-03-28 NOTE — Telephone Encounter (Signed)
 Noted

## 2024-03-28 NOTE — Telephone Encounter (Signed)
 E-scribed refill  Plz schedule CPE and fasting labs (no food/drink- except water and/or blk coffee 5 hrs prior) for additional refills.

## 2024-03-28 NOTE — Telephone Encounter (Signed)
 Called  pt and schedule a appt for cpe / labs

## 2024-06-04 ENCOUNTER — Other Ambulatory Visit: Payer: Self-pay | Admitting: Family Medicine

## 2024-06-04 DIAGNOSIS — E890 Postprocedural hypothyroidism: Secondary | ICD-10-CM

## 2024-06-04 DIAGNOSIS — Z13220 Encounter for screening for lipoid disorders: Secondary | ICD-10-CM

## 2024-06-04 DIAGNOSIS — Z131 Encounter for screening for diabetes mellitus: Secondary | ICD-10-CM

## 2024-06-06 ENCOUNTER — Other Ambulatory Visit (INDEPENDENT_AMBULATORY_CARE_PROVIDER_SITE_OTHER)

## 2024-06-06 DIAGNOSIS — Z136 Encounter for screening for cardiovascular disorders: Secondary | ICD-10-CM

## 2024-06-06 DIAGNOSIS — Z13220 Encounter for screening for lipoid disorders: Secondary | ICD-10-CM

## 2024-06-06 DIAGNOSIS — Z131 Encounter for screening for diabetes mellitus: Secondary | ICD-10-CM | POA: Diagnosis not present

## 2024-06-06 DIAGNOSIS — E890 Postprocedural hypothyroidism: Secondary | ICD-10-CM | POA: Diagnosis not present

## 2024-06-06 LAB — BASIC METABOLIC PANEL WITH GFR
BUN: 11 mg/dL (ref 6–23)
CO2: 27 meq/L (ref 19–32)
Calcium: 8.8 mg/dL (ref 8.4–10.5)
Chloride: 107 meq/L (ref 96–112)
Creatinine, Ser: 0.58 mg/dL (ref 0.40–1.20)
GFR: 113.37 mL/min (ref >60.00)
Glucose, Bld: 81 mg/dL (ref 70–99)
Potassium: 4.7 meq/L (ref 3.5–5.1)
Sodium: 141 meq/L (ref 135–145)

## 2024-06-06 LAB — LIPID PANEL
Cholesterol: 151 mg/dL (ref 0–200)
HDL: 49.1 mg/dL (ref >39.00)
LDL Cholesterol: 93 mg/dL (ref 0–99)
NonHDL: 102.2
Total CHOL/HDL Ratio: 3
Triglycerides: 44 mg/dL (ref 0.0–149.0)
VLDL: 8.8 mg/dL (ref 0.0–40.0)

## 2024-06-06 LAB — TSH: TSH: 1.29 u[IU]/mL (ref 0.35–5.50)

## 2024-06-07 ENCOUNTER — Ambulatory Visit: Payer: Self-pay | Admitting: Family Medicine

## 2024-06-13 ENCOUNTER — Encounter: Payer: Self-pay | Admitting: Family Medicine

## 2024-06-13 ENCOUNTER — Ambulatory Visit: Admitting: Family Medicine

## 2024-06-13 VITALS — BP 112/82 | HR 74 | Temp 98.5°F | Ht 65.0 in | Wt 125.1 lb

## 2024-06-13 DIAGNOSIS — E890 Postprocedural hypothyroidism: Secondary | ICD-10-CM

## 2024-06-13 DIAGNOSIS — R29898 Other symptoms and signs involving the musculoskeletal system: Secondary | ICD-10-CM | POA: Diagnosis not present

## 2024-06-13 DIAGNOSIS — E039 Hypothyroidism, unspecified: Secondary | ICD-10-CM

## 2024-06-13 DIAGNOSIS — Z0000 Encounter for general adult medical examination without abnormal findings: Secondary | ICD-10-CM | POA: Diagnosis not present

## 2024-06-13 DIAGNOSIS — F909 Attention-deficit hyperactivity disorder, unspecified type: Secondary | ICD-10-CM | POA: Insufficient documentation

## 2024-06-13 MED ORDER — LEVOTHYROXINE SODIUM 175 MCG PO TABS: 175.0000 ug | ORAL_TABLET | Freq: Every day | ORAL | 3 refills | Status: AC

## 2024-06-13 NOTE — Assessment & Plan Note (Signed)
 Preventative protocols reviewed and updated unless pt declined. Discussed healthy diet and lifestyle.

## 2024-06-13 NOTE — Patient Instructions (Addendum)
 Good to see you today Levothyroxine  refilled Return as needed or in 1 year for next physical

## 2024-06-13 NOTE — Assessment & Plan Note (Signed)
 Prominent xyphoid with occasional ache, not reproducible on exam.  Will monitor for now, update if enlarging or more painful for imaging.

## 2024-06-13 NOTE — Assessment & Plan Note (Signed)
 Chronic, stable period on levothyroxine  175mcg daily - continue.
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
## 2024-08-30 ENCOUNTER — Other Ambulatory Visit: Payer: Self-pay | Admitting: Obstetrics and Gynecology

## 2024-08-30 DIAGNOSIS — Z1231 Encounter for screening mammogram for malignant neoplasm of breast: Secondary | ICD-10-CM

## 2024-11-08 ENCOUNTER — Other Ambulatory Visit: Payer: Self-pay | Admitting: Obstetrics and Gynecology

## 2024-11-08 DIAGNOSIS — R928 Other abnormal and inconclusive findings on diagnostic imaging of breast: Secondary | ICD-10-CM

## 2024-11-15 ENCOUNTER — Ambulatory Visit
Admission: RE | Admit: 2024-11-15 | Discharge: 2024-11-15 | Disposition: A | Discharge status: Home / Self Care | Source: Ambulatory Visit | Attending: Obstetrics and Gynecology | Admitting: Obstetrics and Gynecology

## 2024-11-15 DIAGNOSIS — R928 Other abnormal and inconclusive findings on diagnostic imaging of breast: Secondary | ICD-10-CM | POA: Insufficient documentation

## 2024-11-15 MED ORDER — GADOBUTROL 1 MMOL/ML IV SOLN
5.0000 mL | Freq: Once | INTRAVENOUS | Status: AC | PRN
  Administered 2024-11-15: 5 mL via INTRAVENOUS
